# Patient Record
Sex: Male | Born: 1955 | Race: White | Hispanic: No | Marital: Married | Smoking: Never smoker
Health system: Southern US, Community
[De-identification: ages and names within clinical notes are randomized; demographics above are authoritative.]

## PROBLEM LIST (undated history)

## (undated) DIAGNOSIS — F419 Anxiety disorder, unspecified: Secondary | ICD-10-CM

## (undated) DIAGNOSIS — R200 Anesthesia of skin: Secondary | ICD-10-CM

## (undated) DIAGNOSIS — R351 Nocturia: Secondary | ICD-10-CM

## (undated) DIAGNOSIS — R202 Paresthesia of skin: Secondary | ICD-10-CM

## (undated) DIAGNOSIS — G4700 Insomnia, unspecified: Secondary | ICD-10-CM

## (undated) DIAGNOSIS — E039 Hypothyroidism, unspecified: Secondary | ICD-10-CM

## (undated) DIAGNOSIS — Z889 Allergy status to unspecified drugs, medicaments and biological substances status: Secondary | ICD-10-CM

## (undated) DIAGNOSIS — F410 Panic disorder [episodic paroxysmal anxiety] without agoraphobia: Secondary | ICD-10-CM

## (undated) DIAGNOSIS — M1990 Unspecified osteoarthritis, unspecified site: Secondary | ICD-10-CM

## (undated) DIAGNOSIS — Z9889 Other specified postprocedural states: Secondary | ICD-10-CM

## (undated) DIAGNOSIS — R112 Nausea with vomiting, unspecified: Secondary | ICD-10-CM

## (undated) HISTORY — PX: CARPAL TUNNEL RELEASE: SHX101

## (undated) HISTORY — PX: COLONOSCOPY: SHX174

## (undated) HISTORY — PX: FRACTURE SURGERY: SHX138

## (undated) HISTORY — PX: KNEE SURGERY: SHX244

## (undated) HISTORY — PX: BACK SURGERY: SHX140

## (undated) HISTORY — PX: MUSCLE REPAIR: SHX867

---

## 2013-06-02 ENCOUNTER — Other Ambulatory Visit: Payer: Self-pay | Admitting: Neurosurgery

## 2013-06-23 ENCOUNTER — Inpatient Hospital Stay (HOSPITAL_COMMUNITY): Admission: RE | Admit: 2013-06-23 | Payer: Self-pay | Source: Ambulatory Visit | Admitting: Neurosurgery

## 2013-06-23 ENCOUNTER — Encounter (HOSPITAL_COMMUNITY): Admission: RE | Payer: Self-pay | Source: Ambulatory Visit

## 2013-06-23 SURGERY — FOR MAXIMUM ACCESS (MAS) POSTERIOR LUMBAR INTERBODY FUSION (PLIF) 1 LEVEL
Anesthesia: General | Site: Back

## 2013-10-24 ENCOUNTER — Other Ambulatory Visit: Payer: Self-pay | Admitting: Neurosurgery

## 2013-11-17 NOTE — Pre-Procedure Instructions (Signed)
Nicholas Morse  11/17/2013   Your procedure is scheduled on: Tuesday, Sept. 15, 2015   Report to [facility] Admitting at 7:00 AM ( per MD)  Call this number if you have problems the morning of surgery: [phone]   Remember:   Do not eat food or drink liquids after midnight.   Take these medicines the morning of surgery with A SIP OF WATER: levothyroxine (SYNTHROID),  If needed: ALPRAZolam Prudy Feeler) for anxiety, HYDROcodone for pain,  albuterol (PROVENTIL0 inhaler for wheezing or shortness of breath 9 bring inhaler in with you on day of procedure) .  Stop taking Aspirin, vitamins, and herbal medications. Do not take any NSAIDs ie: Ibuprofen, Advil, Naproxen or any medication containing Aspirin.   Do not wear jewelry, make-up or nail polish.  Do not wear lotions, powders, or perfumes. You may not wear deodorant.  Do not shave 48 hours prior to surgery. Men may shave face and neck.  Do not bring valuables to the hospital.  [facility] is not responsible for any belongings or valuables.               Contacts, dentures or bridgework may not be worn into surgery.  Leave suitcase in the car. After surgery it may be brought to your room.  For patients admitted to the hospital, discharge time is determined by your treatment team.               Patients discharged the day of surgery will not be allowed to drive home.  Name and phone number of your driver:   Special Instructions:  Special Instructions:Special Instructions: [facility] - Preparing for Surgery  Before surgery, you can play an important role.  Because skin is not sterile, your skin needs to be as free of germs as possible.  You can reduce the number of germs on you skin by washing with CHG (chlorahexidine gluconate) soap before surgery.  CHG is an antiseptic cleaner which kills germs and bonds with the skin to continue killing germs even after washing.  Please DO NOT use if you have an allergy to CHG or antibacterial soaps.   If your skin becomes reddened/irritated stop using the CHG and inform your nurse when you arrive at Short Stay.  Do not shave (including legs and underarms) for at least 48 hours prior to the first CHG shower.  You may shave your face.  Please follow these instructions carefully:   1.  Shower with CHG Soap the night before surgery and the morning of Surgery.  2.  If you choose to wash your hair, wash your hair first as usual with your normal shampoo.  3.  After you shampoo, rinse your hair and body thoroughly to remove the Shampoo.  4.  Use CHG as you would any other liquid soap.  You can apply chg directly  to the skin and wash gently with scrungie or a clean washcloth.  5.  Apply the CHG Soap to your body ONLY FROM THE NECK DOWN.  Do not use on open wounds or open sores.  Avoid contact with your eyes, ears, mouth and genitals (private parts).  Wash genitals (private parts) with your normal soap.  6.  Wash thoroughly, paying special attention to the area where your surgery will be performed.  7.  Thoroughly rinse your body with warm water from the neck down.  8.  DO NOT shower/wash with your normal soap after using and rinsing off the CHG Soap.  9.  Pat yourself dry with a clean towel.            10.  Wear clean pajamas.            11.  Place clean sheets on your bed the night of your first shower and do not sleep with pets.  Day of Surgery  Do not apply any lotions/deodorants the morning of surgery.  Please wear clean clothes to the hospital/surgery center.   Please read over the following fact sheets that you were given: Pain Booklet, Coughing and Deep Breathing, Blood Transfusion Information, MRSA Information and Surgical Site Infection Prevention

## 2013-11-20 ENCOUNTER — Encounter (HOSPITAL_COMMUNITY)
Admission: RE | Admit: 2013-11-20 | Discharge: 2013-11-20 | Disposition: A | Discharge status: Home / Self Care | Payer: BC Managed Care – PPO | Source: Ambulatory Visit | Attending: Neurosurgery | Admitting: Neurosurgery

## 2013-11-20 ENCOUNTER — Encounter (HOSPITAL_COMMUNITY): Payer: Self-pay

## 2013-11-20 HISTORY — DX: Insomnia, unspecified: G47.00

## 2013-11-20 HISTORY — DX: Nocturia: R35.1

## 2013-11-20 HISTORY — DX: Hypothyroidism, unspecified: E03.9

## 2013-11-20 HISTORY — DX: Anesthesia of skin: R20.2

## 2013-11-20 HISTORY — DX: Panic disorder (episodic paroxysmal anxiety): F41.0

## 2013-11-20 HISTORY — DX: Allergy status to unspecified drugs, medicaments and biological substances: Z88.9

## 2013-11-20 HISTORY — DX: Anxiety disorder, unspecified: F41.9

## 2013-11-20 HISTORY — DX: Nausea with vomiting, unspecified: R11.2

## 2013-11-20 HISTORY — DX: Other specified postprocedural states: Z98.890

## 2013-11-20 HISTORY — DX: Paresthesia of skin: R20.0

## 2013-11-20 HISTORY — DX: Unspecified osteoarthritis, unspecified site: M19.90

## 2013-11-20 LAB — BASIC METABOLIC PANEL
ANION GAP: 11 (ref 5–15)
BUN: 16 mg/dL (ref 6–23)
CO2: 27 meq/L (ref 19–32)
Calcium: 9.8 mg/dL (ref 8.4–10.5)
Chloride: 98 mEq/L (ref 96–112)
Creatinine, Ser: 0.98 mg/dL (ref 0.50–1.35)
GFR calc Af Amer: >90 mL/min (ref >90)
GFR, EST NON AFRICAN AMERICAN: 88 mL/min — AB (ref >90)
GLUCOSE: 134 mg/dL — AB (ref 70–99)
POTASSIUM: 4.6 meq/L (ref 3.7–5.3)
SODIUM: 136 meq/L — AB (ref 137–147)

## 2013-11-20 LAB — CBC
HCT: 48.5 % (ref 39.0–52.0)
Hemoglobin: 17.4 g/dL — ABNORMAL HIGH (ref 13.0–17.0)
MCH: 32.7 pg (ref 26.0–34.0)
MCHC: 35.9 g/dL (ref 30.0–36.0)
MCV: 91.2 fL (ref 78.0–100.0)
Platelets: 207 10*3/uL (ref 150–400)
RBC: 5.32 MIL/uL (ref 4.22–5.81)
RDW: 13.7 % (ref 11.5–15.5)
WBC: 7.4 10*3/uL (ref 4.0–10.5)

## 2013-11-20 LAB — SURGICAL PCR SCREEN
MRSA, PCR: NEGATIVE
STAPHYLOCOCCUS AUREUS: POSITIVE — AB

## 2013-11-20 LAB — ABO/RH: ABO/RH(D): A POS

## 2013-11-20 LAB — TYPE AND SCREEN
ABO/RH(D): A POS
Antibody Screen: NEGATIVE

## 2013-11-20 MED ORDER — CEFAZOLIN SODIUM-DEXTROSE 2-3 GM-% IV SOLR
2.0000 g | INTRAVENOUS | Status: AC
  Administered 2013-11-21 (×2): 2 g via INTRAVENOUS
  Filled 2013-11-20: qty 50

## 2013-11-20 NOTE — Progress Notes (Signed)
11/20/13 0828  OBSTRUCTIVE SLEEP APNEA  Have you ever been diagnosed with sleep apnea through a sleep study? No  Do you snore loudly (loud enough to be heard through closed doors)?  0  Do you often feel tired, fatigued, or sleepy during the daytime? 1  Has anyone observed you stop breathing during your sleep? 0  Do you have, or are you being treated for high blood pressure? 0  BMI more than 35 kg/m2? 0  Age over 60 years old? 1  Neck circumference greater than 40 cm/16 inches? 1  Gender: 1  Obstructive Sleep Apnea Score 4

## 2013-11-21 ENCOUNTER — Inpatient Hospital Stay (HOSPITAL_COMMUNITY): Payer: BC Managed Care – PPO | Admitting: Anesthesiology

## 2013-11-21 ENCOUNTER — Encounter (HOSPITAL_COMMUNITY): Payer: Self-pay | Admitting: *Deleted

## 2013-11-21 ENCOUNTER — Inpatient Hospital Stay (HOSPITAL_COMMUNITY): Payer: BC Managed Care – PPO

## 2013-11-21 ENCOUNTER — Encounter (HOSPITAL_COMMUNITY)
Admission: RE | Disposition: A | Discharge status: Home / Self Care | Payer: BC Managed Care – PPO | Source: Ambulatory Visit | Attending: Neurosurgery

## 2013-11-21 ENCOUNTER — Encounter (HOSPITAL_COMMUNITY): Payer: BC Managed Care – PPO | Admitting: Anesthesiology

## 2013-11-21 ENCOUNTER — Inpatient Hospital Stay (HOSPITAL_COMMUNITY)
Admission: RE | Admit: 2013-11-21 | Discharge: 2013-11-22 | DRG: 460 | Disposition: A | Discharge status: Home / Self Care | Payer: BC Managed Care – PPO | Source: Ambulatory Visit | Attending: Neurosurgery | Admitting: Neurosurgery

## 2013-11-21 DIAGNOSIS — Z79899 Other long term (current) drug therapy: Secondary | ICD-10-CM | POA: Diagnosis not present

## 2013-11-21 DIAGNOSIS — M5126 Other intervertebral disc displacement, lumbar region: Principal | ICD-10-CM | POA: Diagnosis present

## 2013-11-21 DIAGNOSIS — M545 Low back pain, unspecified: Secondary | ICD-10-CM | POA: Diagnosis present

## 2013-11-21 DIAGNOSIS — M4310 Spondylolisthesis, site unspecified: Secondary | ICD-10-CM | POA: Diagnosis present

## 2013-11-21 DIAGNOSIS — M4316 Spondylolisthesis, lumbar region: Secondary | ICD-10-CM | POA: Diagnosis present

## 2013-11-21 DIAGNOSIS — R030 Elevated blood-pressure reading, without diagnosis of hypertension: Secondary | ICD-10-CM | POA: Diagnosis present

## 2013-11-21 HISTORY — PX: MAXIMUM ACCESS (MAS)POSTERIOR LUMBAR INTERBODY FUSION (PLIF) 2 LEVEL: SHX6369

## 2013-11-21 SURGERY — FOR MAXIMUM ACCESS (MAS) POSTERIOR LUMBAR INTERBODY FUSION (PLIF) 2 LEVEL
Anesthesia: General | Site: Back

## 2013-11-21 MED ORDER — PROPOFOL 10 MG/ML IV BOLUS
INTRAVENOUS | Status: AC
  Filled 2013-11-21: qty 20

## 2013-11-21 MED ORDER — PROPOFOL 10 MG/ML IV BOLUS
INTRAVENOUS | Status: AC
Start: 2013-11-21 — End: 2013-11-21
  Filled 2013-11-21: qty 20

## 2013-11-21 MED ORDER — PHENYLEPHRINE HCL 10 MG/ML IJ SOLN
INTRAMUSCULAR | Status: AC
  Filled 2013-11-21: qty 1

## 2013-11-21 MED ORDER — SCOPOLAMINE 1 MG/3DAYS TD PT72
MEDICATED_PATCH | TRANSDERMAL | Status: DC | PRN
  Administered 2013-11-21: 1 via TRANSDERMAL

## 2013-11-21 MED ORDER — SCOPOLAMINE 1 MG/3DAYS TD PT72
MEDICATED_PATCH | TRANSDERMAL | Status: AC
  Filled 2013-11-21: qty 1

## 2013-11-21 MED ORDER — LIDOCAINE-EPINEPHRINE 1 %-1:100000 IJ SOLN
INTRAMUSCULAR | Status: DC | PRN
  Administered 2013-11-21: 10 mL

## 2013-11-21 MED ORDER — FENTANYL CITRATE 0.05 MG/ML IJ SOLN
INTRAMUSCULAR | Status: AC
  Filled 2013-11-21: qty 5

## 2013-11-21 MED ORDER — HYDROMORPHONE HCL PF 1 MG/ML IJ SOLN
INTRAMUSCULAR | Status: AC
  Filled 2013-11-21: qty 1

## 2013-11-21 MED ORDER — SUCCINYLCHOLINE CHLORIDE 20 MG/ML IJ SOLN
INTRAMUSCULAR | Status: DC | PRN
  Administered 2013-11-21: 100 mg via INTRAVENOUS

## 2013-11-21 MED ORDER — AMPHETAMINE-DEXTROAMPHETAMINE 10 MG PO TABS: 20.0000 mg | ORAL_TABLET | Freq: Two times a day (BID) | ORAL | Status: DC

## 2013-11-21 MED ORDER — BUPIVACAINE LIPOSOME 1.3 % IJ SUSP
INTRAMUSCULAR | Status: DC | PRN
  Administered 2013-11-21: 20 mL

## 2013-11-21 MED ORDER — MUPIROCIN 2 % EX OINT
TOPICAL_OINTMENT | Freq: Two times a day (BID) | CUTANEOUS | Status: DC
  Administered 2013-11-22: 09:00:00 via NASAL

## 2013-11-21 MED ORDER — POLYETHYLENE GLYCOL 3350 17 G PO PACK
17.0000 g | PACK | Freq: Every day | ORAL | Status: DC | PRN
  Filled 2013-11-21: qty 1

## 2013-11-21 MED ORDER — SODIUM CHLORIDE 0.9 % IJ SOLN: 3.0000 mL | INTRAMUSCULAR | Status: DC | PRN

## 2013-11-21 MED ORDER — ACETAMINOPHEN 325 MG PO TABS: 650.0000 mg | ORAL_TABLET | ORAL | Status: DC | PRN

## 2013-11-21 MED ORDER — ALBUTEROL SULFATE HFA 108 (90 BASE) MCG/ACT IN AERS: 1.0000 | INHALATION_SPRAY | Freq: Four times a day (QID) | RESPIRATORY_TRACT | Status: DC | PRN

## 2013-11-21 MED ORDER — LACTATED RINGERS IV SOLN
INTRAVENOUS | Status: DC | PRN
  Administered 2013-11-21 (×2): via INTRAVENOUS

## 2013-11-21 MED ORDER — SUCCINYLCHOLINE CHLORIDE 20 MG/ML IJ SOLN
INTRAMUSCULAR | Status: AC
  Filled 2013-11-21: qty 1

## 2013-11-21 MED ORDER — LEVOTHYROXINE SODIUM 150 MCG PO TABS
150.0000 ug | ORAL_TABLET | Freq: Every day | ORAL | Status: DC
  Administered 2013-11-22: 150 ug via ORAL
  Filled 2013-11-21 (×2): qty 1

## 2013-11-21 MED ORDER — 0.9 % SODIUM CHLORIDE (POUR BTL) OPTIME
TOPICAL | Status: DC | PRN
  Administered 2013-11-21: 1000 mL

## 2013-11-21 MED ORDER — LIDOCAINE HCL (CARDIAC) 20 MG/ML IV SOLN
INTRAVENOUS | Status: AC
  Filled 2013-11-21: qty 5

## 2013-11-21 MED ORDER — KCL IN DEXTROSE-NACL 20-5-0.45 MEQ/L-%-% IV SOLN
INTRAVENOUS | Status: DC
  Filled 2013-11-21 (×3): qty 1000

## 2013-11-21 MED ORDER — TESTOSTERONE 20.25 MG/ACT (1.62%) TD GEL: 10.0000 g | Freq: Every day | TRANSDERMAL | Status: DC

## 2013-11-21 MED ORDER — BISACODYL 10 MG RE SUPP: 10.0000 mg | Freq: Every day | RECTAL | Status: DC | PRN

## 2013-11-21 MED ORDER — PHENOL 1.4 % MT LIQD: 1.0000 | OROMUCOSAL | Status: DC | PRN

## 2013-11-21 MED ORDER — DOCUSATE SODIUM 100 MG PO CAPS
100.0000 mg | ORAL_CAPSULE | Freq: Two times a day (BID) | ORAL | Status: DC
  Administered 2013-11-21 – 2013-11-22 (×2): 100 mg via ORAL
  Filled 2013-11-21 (×3): qty 1

## 2013-11-21 MED ORDER — SENNA 8.6 MG PO TABS
1.0000 | ORAL_TABLET | Freq: Two times a day (BID) | ORAL | Status: DC
  Administered 2013-11-21 – 2013-11-22 (×2): 8.6 mg via ORAL
  Filled 2013-11-21 (×3): qty 1

## 2013-11-21 MED ORDER — OXYCODONE HCL 5 MG PO TABS: 5.0000 mg | ORAL_TABLET | Freq: Once | ORAL | Status: DC | PRN

## 2013-11-21 MED ORDER — HYDROCODONE-ACETAMINOPHEN 5-325 MG PO TABS: 1.0000 | ORAL_TABLET | Freq: Two times a day (BID) | ORAL | Status: DC | PRN

## 2013-11-21 MED ORDER — ARTIFICIAL TEARS OP OINT
TOPICAL_OINTMENT | OPHTHALMIC | Status: DC | PRN
  Administered 2013-11-21: 1 via OPHTHALMIC

## 2013-11-21 MED ORDER — BUPIVACAINE HCL (PF) 0.5 % IJ SOLN
INTRAMUSCULAR | Status: DC | PRN
  Administered 2013-11-21: 10 mL

## 2013-11-21 MED ORDER — ACETAMINOPHEN 650 MG RE SUPP: 650.0000 mg | RECTAL | Status: DC | PRN

## 2013-11-21 MED ORDER — LACTATED RINGERS IV SOLN
INTRAVENOUS | Status: DC | PRN
  Administered 2013-11-21: 11:00:00 via INTRAVENOUS

## 2013-11-21 MED ORDER — MORPHINE SULFATE 2 MG/ML IJ SOLN
1.0000 mg | INTRAMUSCULAR | Status: DC | PRN
  Administered 2013-11-21 – 2013-11-22 (×2): 4 mg via INTRAVENOUS
  Filled 2013-11-21 (×2): qty 2

## 2013-11-21 MED ORDER — ONDANSETRON HCL 4 MG/2ML IJ SOLN: 4.0000 mg | INTRAMUSCULAR | Status: DC | PRN

## 2013-11-21 MED ORDER — PROPOFOL 10 MG/ML IV BOLUS
INTRAVENOUS | Status: DC | PRN
  Administered 2013-11-21: 140 mg via INTRAVENOUS
  Administered 2013-11-21: 60 mg via INTRAVENOUS

## 2013-11-21 MED ORDER — CEFAZOLIN SODIUM-DEXTROSE 2-3 GM-% IV SOLR
INTRAVENOUS | Status: AC
  Filled 2013-11-21: qty 50

## 2013-11-21 MED ORDER — DEXAMETHASONE SODIUM PHOSPHATE 10 MG/ML IJ SOLN
INTRAMUSCULAR | Status: AC
Start: 2013-11-21 — End: 2013-11-21
  Filled 2013-11-21: qty 1

## 2013-11-21 MED ORDER — PROPOFOL INFUSION 10 MG/ML OPTIME
INTRAVENOUS | Status: DC | PRN
  Administered 2013-11-21: 11:00:00 via INTRAVENOUS
  Administered 2013-11-21: 50 ug/kg/min via INTRAVENOUS
  Administered 2013-11-21: 12:00:00 via INTRAVENOUS

## 2013-11-21 MED ORDER — METHOCARBAMOL 500 MG PO TABS
500.0000 mg | ORAL_TABLET | Freq: Four times a day (QID) | ORAL | Status: DC | PRN
  Administered 2013-11-21 – 2013-11-22 (×3): 500 mg via ORAL
  Filled 2013-11-21 (×3): qty 1

## 2013-11-21 MED ORDER — BUPIVACAINE LIPOSOME 1.3 % IJ SUSP
20.0000 mL | Freq: Once | INTRAMUSCULAR | Status: DC
  Filled 2013-11-21: qty 20

## 2013-11-21 MED ORDER — ONDANSETRON HCL 4 MG/2ML IJ SOLN
INTRAMUSCULAR | Status: DC | PRN
  Administered 2013-11-21: 4 mg via INTRAVENOUS

## 2013-11-21 MED ORDER — LACTATED RINGERS IV SOLN
INTRAVENOUS | Status: DC
  Administered 2013-11-21: 08:00:00 via INTRAVENOUS

## 2013-11-21 MED ORDER — OXYCODONE-ACETAMINOPHEN 5-325 MG PO TABS: 1.0000 | ORAL_TABLET | ORAL | Status: DC | PRN

## 2013-11-21 MED ORDER — SODIUM CHLORIDE 0.9 % IJ SOLN
3.0000 mL | Freq: Two times a day (BID) | INTRAMUSCULAR | Status: DC
  Administered 2013-11-21: 3 mL via INTRAVENOUS

## 2013-11-21 MED ORDER — OXYCODONE HCL 5 MG/5ML PO SOLN: 5.0000 mg | Freq: Once | ORAL | Status: DC | PRN

## 2013-11-21 MED ORDER — HYDROCODONE-ACETAMINOPHEN 10-325 MG PO TABS
1.0000 | ORAL_TABLET | ORAL | Status: DC | PRN
  Administered 2013-11-21 – 2013-11-22 (×4): 2 via ORAL
  Filled 2013-11-21 (×4): qty 2

## 2013-11-21 MED ORDER — ALUM & MAG HYDROXIDE-SIMETH 200-200-20 MG/5ML PO SUSP: 30.0000 mL | Freq: Four times a day (QID) | ORAL | Status: DC | PRN

## 2013-11-21 MED ORDER — SODIUM CHLORIDE 0.9 % IV SOLN
10.0000 mg | INTRAVENOUS | Status: DC | PRN
  Administered 2013-11-21: 25 ug/min via INTRAVENOUS

## 2013-11-21 MED ORDER — HYDROCODONE-ACETAMINOPHEN 5-325 MG PO TABS
1.0000 | ORAL_TABLET | ORAL | Status: DC | PRN
  Administered 2013-11-21: 2 via ORAL
  Filled 2013-11-21: qty 2

## 2013-11-21 MED ORDER — PHENYLEPHRINE 40 MCG/ML (10ML) SYRINGE FOR IV PUSH (FOR BLOOD PRESSURE SUPPORT)
PREFILLED_SYRINGE | INTRAVENOUS | Status: AC
  Filled 2013-11-21: qty 10

## 2013-11-21 MED ORDER — CEFAZOLIN SODIUM 1-5 GM-% IV SOLN
1.0000 g | Freq: Three times a day (TID) | INTRAVENOUS | Status: AC
  Administered 2013-11-21 – 2013-11-22 (×2): 1 g via INTRAVENOUS
  Filled 2013-11-21 (×2): qty 50

## 2013-11-21 MED ORDER — TEMAZEPAM 15 MG PO CAPS
60.0000 mg | ORAL_CAPSULE | Freq: Every day | ORAL | Status: DC
  Administered 2013-11-21: 60 mg via ORAL
  Filled 2013-11-21: qty 4

## 2013-11-21 MED ORDER — MIDAZOLAM HCL 2 MG/2ML IJ SOLN
INTRAMUSCULAR | Status: AC
  Filled 2013-11-21: qty 2

## 2013-11-21 MED ORDER — ALPRAZOLAM 0.5 MG PO TABS: 1.0000 mg | ORAL_TABLET | Freq: Two times a day (BID) | ORAL | Status: DC | PRN

## 2013-11-21 MED ORDER — FLEET ENEMA 7-19 GM/118ML RE ENEM
1.0000 | ENEMA | Freq: Once | RECTAL | Status: AC | PRN
  Filled 2013-11-21: qty 1

## 2013-11-21 MED ORDER — ALBUTEROL SULFATE (2.5 MG/3ML) 0.083% IN NEBU: 2.5000 mg | INHALATION_SOLUTION | Freq: Four times a day (QID) | RESPIRATORY_TRACT | Status: DC | PRN

## 2013-11-21 MED ORDER — LIDOCAINE HCL (CARDIAC) 20 MG/ML IV SOLN
INTRAVENOUS | Status: DC | PRN
  Administered 2013-11-21: 30 mg via INTRAVENOUS
  Administered 2013-11-21 (×2): 50 mg via INTRAVENOUS

## 2013-11-21 MED ORDER — PHENYLEPHRINE HCL 10 MG/ML IJ SOLN
INTRAMUSCULAR | Status: DC | PRN
  Administered 2013-11-21: 40 ug via INTRAVENOUS
  Administered 2013-11-21: 80 ug via INTRAVENOUS
  Administered 2013-11-21: 40 ug via INTRAVENOUS
  Administered 2013-11-21 (×3): 80 ug via INTRAVENOUS

## 2013-11-21 MED ORDER — MIDAZOLAM HCL 5 MG/5ML IJ SOLN
INTRAMUSCULAR | Status: DC | PRN
  Administered 2013-11-21: 2 mg via INTRAVENOUS

## 2013-11-21 MED ORDER — FENTANYL CITRATE 0.05 MG/ML IJ SOLN
INTRAMUSCULAR | Status: DC | PRN
  Administered 2013-11-21 (×2): 50 ug via INTRAVENOUS
  Administered 2013-11-21: 200 ug via INTRAVENOUS
  Administered 2013-11-21 (×3): 50 ug via INTRAVENOUS
  Administered 2013-11-21: 100 ug via INTRAVENOUS
  Administered 2013-11-21 (×2): 50 ug via INTRAVENOUS

## 2013-11-21 MED ORDER — ONDANSETRON HCL 4 MG/2ML IJ SOLN
INTRAMUSCULAR | Status: AC
  Filled 2013-11-21: qty 2

## 2013-11-21 MED ORDER — DEXAMETHASONE SODIUM PHOSPHATE 10 MG/ML IJ SOLN
INTRAMUSCULAR | Status: DC | PRN
  Administered 2013-11-21: 10 mg via INTRAVENOUS

## 2013-11-21 MED ORDER — MENTHOL 3 MG MT LOZG: 1.0000 | LOZENGE | OROMUCOSAL | Status: DC | PRN

## 2013-11-21 MED ORDER — THROMBIN 20000 UNITS EX SOLR
CUTANEOUS | Status: DC | PRN
  Administered 2013-11-21: 10:00:00 via TOPICAL

## 2013-11-21 MED ORDER — HYDROMORPHONE HCL PF 1 MG/ML IJ SOLN
0.2500 mg | INTRAMUSCULAR | Status: DC | PRN
  Administered 2013-11-21 (×4): 0.5 mg via INTRAVENOUS

## 2013-11-21 SURGICAL SUPPLY — 84 items
BAG DECANTER FOR FLEXI CONT (MISCELLANEOUS) ×3 IMPLANT
BENZOIN TINCTURE PRP APPL 2/3 (GAUZE/BANDAGES/DRESSINGS) IMPLANT
BLADE SURG ROTATE 9660 (MISCELLANEOUS) IMPLANT
BONE MATRIX OSTEOCEL PRO MED (Bone Implant) ×6 IMPLANT
BUR MATCHSTICK NEURO 3.0 LAGG (BURR) ×3 IMPLANT
BUR PRECISION FLUTE 5.0 (BURR) ×6 IMPLANT
CAGE MAS PLIF 9X9X23-8 LUMBAR (Cage) ×12 IMPLANT
CANISTER SUCT 3000ML (MISCELLANEOUS) ×3 IMPLANT
CLOSURE WOUND 1/2 X4 (GAUZE/BANDAGES/DRESSINGS) ×1
CONT SPEC 4OZ CLIKSEAL STRL BL (MISCELLANEOUS) ×6 IMPLANT
COVER BACK TABLE 24X17X13 BIG (DRAPES) IMPLANT
COVER TABLE BACK 60X90 (DRAPES) ×3 IMPLANT
DERMABOND ADVANCED (GAUZE/BANDAGES/DRESSINGS) ×2
DERMABOND ADVANCED .7 DNX12 (GAUZE/BANDAGES/DRESSINGS) ×1 IMPLANT
DRAPE C-ARM 42X72 X-RAY (DRAPES) ×3 IMPLANT
DRAPE C-ARMOR (DRAPES) ×3 IMPLANT
DRAPE LAPAROTOMY 100X72X124 (DRAPES) ×3 IMPLANT
DRAPE POUCH INSTRU U-SHP 10X18 (DRAPES) ×3 IMPLANT
DRAPE SURG 17X23 STRL (DRAPES) ×3 IMPLANT
DRSG OPSITE POSTOP 4X6 (GAUZE/BANDAGES/DRESSINGS) ×3 IMPLANT
DRSG TELFA 3X8 NADH (GAUZE/BANDAGES/DRESSINGS) IMPLANT
DURAPREP 26ML APPLICATOR (WOUND CARE) ×3 IMPLANT
ELECT BLADE 4.0 EZ CLEAN MEGAD (MISCELLANEOUS) ×3
ELECT REM PT RETURN 9FT ADLT (ELECTROSURGICAL) ×3
ELECTRODE BLDE 4.0 EZ CLN MEGD (MISCELLANEOUS) ×1 IMPLANT
ELECTRODE REM PT RTRN 9FT ADLT (ELECTROSURGICAL) ×1 IMPLANT
EVACUATOR 1/8 PVC DRAIN (DRAIN) ×3 IMPLANT
GAUZE SPONGE 4X4 12PLY STRL (GAUZE/BANDAGES/DRESSINGS) ×3 IMPLANT
GAUZE SPONGE 4X4 16PLY XRAY LF (GAUZE/BANDAGES/DRESSINGS) IMPLANT
GLOVE BIO SURGEON STRL SZ8 (GLOVE) ×6 IMPLANT
GLOVE BIOGEL PI IND STRL 8 (GLOVE) ×2 IMPLANT
GLOVE BIOGEL PI IND STRL 8.5 (GLOVE) ×3 IMPLANT
GLOVE BIOGEL PI INDICATOR 8 (GLOVE) ×4
GLOVE BIOGEL PI INDICATOR 8.5 (GLOVE) ×6
GLOVE ECLIPSE 8.0 STRL XLNG CF (GLOVE) ×6 IMPLANT
GLOVE ECLIPSE 8.5 STRL (GLOVE) ×3 IMPLANT
GLOVE EXAM NITRILE LRG STRL (GLOVE) ×6 IMPLANT
GLOVE EXAM NITRILE MD LF STRL (GLOVE) IMPLANT
GLOVE EXAM NITRILE XL STR (GLOVE) IMPLANT
GLOVE EXAM NITRILE XS STR PU (GLOVE) IMPLANT
GOWN STRL REUS W/ TWL LRG LVL3 (GOWN DISPOSABLE) IMPLANT
GOWN STRL REUS W/ TWL XL LVL3 (GOWN DISPOSABLE) ×3 IMPLANT
GOWN STRL REUS W/TWL 2XL LVL3 (GOWN DISPOSABLE) ×6 IMPLANT
GOWN STRL REUS W/TWL LRG LVL3 (GOWN DISPOSABLE)
GOWN STRL REUS W/TWL XL LVL3 (GOWN DISPOSABLE) ×6
KIT BASIN OR (CUSTOM PROCEDURE TRAY) ×3 IMPLANT
KIT NEEDLE NVM5 EMG ELECT (KITS) ×1 IMPLANT
KIT NEEDLE NVM5 EMG ELECTRODE (KITS) ×2
KIT POSITION SURG JACKSON T1 (MISCELLANEOUS) ×3 IMPLANT
KIT ROOM TURNOVER OR (KITS) ×3 IMPLANT
MILL MEDIUM DISP (BLADE) ×3 IMPLANT
NEEDLE HYPO 21X1.5 SAFETY (NEEDLE) ×3 IMPLANT
NEEDLE HYPO 25X1 1.5 SAFETY (NEEDLE) ×3 IMPLANT
NEEDLE SPNL 18GX3.5 QUINCKE PK (NEEDLE) IMPLANT
NS IRRIG 1000ML POUR BTL (IV SOLUTION) ×3 IMPLANT
PACK LAMINECTOMY NEURO (CUSTOM PROCEDURE TRAY) ×3 IMPLANT
PAD ARMBOARD 7.5X6 YLW CONV (MISCELLANEOUS) ×9 IMPLANT
PATTIES SURGICAL .5 X.5 (GAUZE/BANDAGES/DRESSINGS) IMPLANT
PATTIES SURGICAL .5 X1 (DISPOSABLE) IMPLANT
PATTIES SURGICAL 1X1 (DISPOSABLE) IMPLANT
ROD 50MM LUMBAR (Rod) ×6 IMPLANT
SCREW LOCK (Screw) ×12 IMPLANT
SCREW LOCK FXNS SPNE MAS PL (Screw) ×6 IMPLANT
SCREW PLIF MAS 5.5X35 LUMBAR (Screw) ×6 IMPLANT
SCREW SHANK 6.5X65 (Screw) ×6 IMPLANT
SCREW SHANKS 5.5X35 (Screw) ×6 IMPLANT
SCREW TULIP 5.5 (Screw) ×12 IMPLANT
SPONGE LAP 4X18 X RAY DECT (DISPOSABLE) IMPLANT
SPONGE SURGIFOAM ABS GEL 100 (HEMOSTASIS) ×3 IMPLANT
STAPLER SKIN PROX WIDE 3.9 (STAPLE) ×3 IMPLANT
STRIP CLOSURE SKIN 1/2X4 (GAUZE/BANDAGES/DRESSINGS) ×2 IMPLANT
SUT VIC AB 1 CT1 18XBRD ANBCTR (SUTURE) ×2 IMPLANT
SUT VIC AB 1 CT1 8-18 (SUTURE) ×4
SUT VIC AB 2-0 CT1 18 (SUTURE) ×6 IMPLANT
SUT VIC AB 3-0 SH 8-18 (SUTURE) ×6 IMPLANT
SYR 20CC LL (SYRINGE) ×3 IMPLANT
SYR 20ML ECCENTRIC (SYRINGE) ×3 IMPLANT
SYR 3ML LL SCALE MARK (SYRINGE) IMPLANT
SYR 5ML LL (SYRINGE) IMPLANT
TOWEL OR 17X24 6PK STRL BLUE (TOWEL DISPOSABLE) ×3 IMPLANT
TOWEL OR 17X26 10 PK STRL BLUE (TOWEL DISPOSABLE) ×3 IMPLANT
TRAP SPECIMEN MUCOUS 40CC (MISCELLANEOUS) ×3 IMPLANT
TRAY FOLEY CATH 14FRSI W/METER (CATHETERS) IMPLANT
WATER STERILE IRR 1000ML POUR (IV SOLUTION) ×3 IMPLANT

## 2013-11-21 NOTE — Transfer of Care (Signed)
Immediate Anesthesia Transfer of Care Note  Patient: Nicholas Morse  Procedure(s) Performed: Procedure(s): FOR MAXIMUM ACCESS (MAS) POSTERIOR LUMBAR INTERBODY FUSION LUMBAR FOUR-FIVE, LUMBAR FIVE-SACRAL ONE WITH REDO DECOMPRESSION (N/A)  Patient Location: PACU  Anesthesia Type:General  Level of Consciousness: patient cooperative and responds to stimulation  Airway & Oxygen Therapy: Patient Spontanous Breathing and Patient connected to face mask oxygen  Post-op Assessment: Report given to PACU RN, Post -op Vital signs reviewed and stable and Patient moving all extremities X 4  Post vital signs: Reviewed and stable  Complications: No apparent anesthesia complications

## 2013-11-21 NOTE — Anesthesia Postprocedure Evaluation (Signed)
  Anesthesia Post-op Note  Patient: Nicholas Morse  Procedure(s) Performed: Procedure(s): FOR MAXIMUM ACCESS (MAS) POSTERIOR LUMBAR INTERBODY FUSION LUMBAR FOUR-FIVE, LUMBAR FIVE-SACRAL ONE WITH REDO DECOMPRESSION (N/A)  Patient Location: PACU  Anesthesia Type:General  Level of Consciousness: awake and alert   Airway and Oxygen Therapy: Patient Spontanous Breathing  Post-op Pain: moderate  Post-op Assessment: Post-op Vital signs reviewed, Patient's Cardiovascular Status Stable and Respiratory Function Stable  Post-op Vital Signs: Reviewed  Filed Vitals:   11/21/13 1541  BP: 116/80  Pulse: 87  Temp:   Resp: 17    Complications: No apparent anesthesia complications

## 2013-11-21 NOTE — Plan of Care (Signed)
Problem: Consults Goal: Diagnosis - Spinal Surgery Outcome: Completed/Met Date Met:  11/21/13 Thoraco/Lumbar Spine Fusion     

## 2013-11-21 NOTE — Evaluation (Addendum)
Occupational Therapy Evaluation Patient Details Name: Nicholas Morse MRN: [id number] DOB: [birth year] Today's Date: 11/21/2013    History of Present Illness 58 y.o. s/p MAXIMUM ACCESS (MAS) POSTERIOR LUMBAR INTERBODY FUSION LUMBAR FOUR-FIVE, LUMBAR FIVE-SACRAL ONE WITH REDO DECOMPRESSION   Clinical Impression   Pt s/p above. Pt independent with ADLs, PTA. Feel pt will benefit from acute OT to increase independence prior to d/c.     Follow Up Recommendations  No OT follow up;Supervision - Intermittent    Equipment Recommendations  3 in 1 bedside comode    Recommendations for Other Services       Precautions / Restrictions Precautions Precautions: Back Precaution Booklet Issued: Yes (comment) Precaution Comments: Educated on back precautions Required Braces or Orthoses: Spinal Brace Spinal Brace: Lumbar corset;Applied in sitting position;Applied in standing position Restrictions Weight Bearing Restrictions: No      Mobility Bed Mobility Overal bed mobility: Needs Assistance Bed Mobility: Rolling;Sidelying to Sit;Sit to Sidelying Rolling: Supervision Sidelying to sit: Supervision     Sit to sidelying: Modified independent (Device/Increase time) General bed mobility comments: cues for technique.  Transfers Overall transfer level: Needs assistance Equipment used: None Transfers: Sit to/from Stand Sit to Stand: Supervision         General transfer comment: cues for technique    Balance                                            ADL Overall ADL's : Needs assistance/impaired                 Upper Body Dressing : Minimal assistance;Sitting;Standing   Lower Body Dressing: Minimal assistance;Sit to/from stand;With adaptive equipment   Toilet Transfer: Min guard;Ambulation (bed)   Toileting- Clothing Manipulation and Hygiene: Supervision/safety;Sit to/from stand   Tub/ Shower Transfer: Min guard;Ambulation   Functional mobility during  ADLs: Min guard General ADL Comments: Educated on use of cup for teeth care and placement of grooming items to avoid breaking precautions. Educated on AE for LB ADLs as pt unable to cross legs over knees. Educated on back brace. Educated on Psychologist, prison and probation services. Discussed positioning of pillows.      Vision                     Perception     Praxis      Pertinent Vitals/Pain Pain Assessment: 0-10 Pain Score: 6  Pain Location: back Pain Descriptors / Indicators: Cramping     Hand Dominance Right   Extremity/Trunk Assessment Upper Extremity Assessment Upper Extremity Assessment: Overall WFL for tasks assessed   Lower Extremity Assessment Lower Extremity Assessment: Defer to PT evaluation       Communication Communication Communication: No difficulties   Cognition Arousal/Alertness: Awake/alert Behavior During Therapy: WFL for tasks assessed/performed Overall Cognitive Status: Within Functional Limits for tasks assessed                     General Comments       Exercises       Shoulder Instructions      Home Living Family/patient expects to be discharged to:: Private residence Living Arrangements: Spouse/significant other Available Help at Discharge: Family;Available 24 hours/day Type of Home: House Home Access: Stairs to enter [employer] of Steps: 2 Entrance Stairs-Rails: None Home Layout: Two level Alternate Level Stairs-Number of Steps: 16 Alternate Level Stairs-Rails: Left Bathroom Shower/Tub:  Walk-in shower;Tub/shower unit   [employer]: Standard     Home Equipment: Shower seat - built in;Other (comment) (stool that can be used for shower)          Prior Functioning/Environment Level of Independence: Independent             OT Diagnosis: Acute pain   OT Problem List: Impaired balance (sitting and/or standing);Decreased range of motion;Decreased knowledge of use of DME or AE;Decreased knowledge of precautions;Pain    OT Treatment/Interventions: Self-care/ADL training;DME and/or AE instruction;Therapeutic activities;Balance training;Patient/family education    OT Goals(Current goals can be found in the care plan section) Acute Rehab OT Goals Patient Stated Goal: not stated OT Goal Formulation: With patient Time For Goal Achievement: 11/28/13 Potential to Achieve Goals: Good ADL Goals Pt Will Perform Lower Body Dressing: with modified independence;with adaptive equipment;sit to/from stand Pt Will Transfer to Toilet: with modified independence;ambulating Additional ADL Goal #1: Pt will independently don/doff back brace. Additional ADL Goal #2: Pt will independently verbalize and demonstrate 3/3 back precautions.  OT Frequency: Min 2X/week   Barriers to D/C:            Co-evaluation              End of Session Equipment Utilized During Treatment: Back brace Nurse Communication: Other (comment) (3 in 1)  Activity Tolerance: Patient tolerated treatment well Patient left: in chair;with family/visitor present   Time: 5009-3818 OT Time Calculation (min): 21 min Charges:  OT General Charges $OT Visit: 1 Procedure OT Evaluation $Initial OT Evaluation Tier I: 1 Procedure OT Treatments $Self Care/Home Management : 8-22 mins G-CodesEarlie Raveling OTR/L [phone] 11/21/2013, 6:42 PM

## 2013-11-21 NOTE — Progress Notes (Signed)
Awake, alert, conversant.  Full strength both legs.  Doing well.  

## 2013-11-21 NOTE — Op Note (Signed)
11/21/2013  3:10 PM  PATIENT:  Nicholas Morse  58 y.o. male  PRE-OPERATIVE DIAGNOSIS:  Spondylolisthesis, recurrent lumbarStenosis, herniated lumbar disc,Radiculopathy, Lumbago L 45 and L 5 S 1 levels  POST-OPERATIVE DIAGNOSIS:  Spondylolisthesis, recurrent lumbarStenosis, herniated lumbar disc,Radiculopathy, Lumbago L 45 and L 5 S 1 levels   PROCEDURE:  Procedure(s): FOR MAXIMUM ACCESS (MAS) POSTERIOR LUMBAR INTERBODY FUSION LUMBAR FOUR-FIVE, LUMBAR FIVE-SACRAL ONE WITH REDO DECOMPRESSION (N/A) with PEEK interbody fusion, pedicle screw fixation and posterolateral arthrodesis  Decompression in excess of standard PLIF procedure with painstaking dissection through scar of previously decompressed neural elements (bilateral L 4, L 5, S 1)  SURGEON:  Surgeon(s) and Role:    * Maeola Harman, MD - Primary    * Barnett Abu, MD - Assisting  PHYSICIAN ASSISTANT:   ASSISTANTS: Poteat, RN   ANESTHESIA:   general  EBL:  Total I/O In: 2300 [I.V.:2300] Out: 1215 [Urine:715; Blood:500]  BLOOD ADMINISTERED:none  DRAINS: none   LOCAL MEDICATIONS USED:  MARCAINE     SPECIMEN:  No Specimen  DISPOSITION OF SPECIMEN:  N/A  COUNTS:  YES  TOURNIQUET:  * No tourniquets in log *  DICTATION: Patient is a 58 year old with spondylosis , recurrent stenosis, spondylolisthesis, disc herniation and severe back and right lower extremity pain at L4/5 and L 5 S 1 levels of the lumbar spine. It was elected to take him to surgery for redo decompression, MASPLIF L 45 and L 5 S 1 levels with posterolateral arthrodesis.  Procedure:   Following uncomplicated induction of GETA, and placement of electrodes for neural monitoring, patient was turned into a prone position on the [place] tableand using AP  fluoroscopy the area of planned incision was marked, prepped with betadine scrub and Duraprep, then draped. Exposure was performed of facet joint complex at L 45 and L 5 S 1 level and the MAS retractor was placed.5.5 x  35 mm cortical Nuvasive screws were placed at L 4 bilaterally according to standard landmarks using neural monitoring.  A total laminectomy of L 4 and L 5 was then performed with disarticulation of facets.  This was previously operated with abundant scar tissue and all neural elements were carefully dissected free from investing scar tissue.  Decompression waas far more extensive than that typically performed with standard PLIF procedure with dissection of all scarred in neural elements (bilateral L 4, L 5, S 1 nerve roots).This bone was saved for grafting, combined with Osteocel after being run through bone mill and was placed in bone packing device.  Thorough discectomy was performed bilaterally at L 45 and L 5 S 1 and the endplates were prepared for grafting.  23 x 9 x 8 degree cages were placed in the interspace and positioning was confirmed with AP and lateral fluoroscopy.  10 cc of autograft/Osteocel was packed in the interspace medial to the second cage at each level.   Remaining screws were placed at L 5 and S 1  and 50 mm rods were placed.   And the screws were locked and torqued. Final Xrays showed well positioned implants and screw fixation. The posterolateral region was packed with remaining 30 cc of autograft on the right of midline. The wounds were irrigated and then closed with 1, 2-0 and 3-0 Vicryl stitches. Sterile occlusive dressing was placed with Dermabond and an occlusive dressing. The patient was then extubated in the operating room and taken to recovery in stable and satisfactory condition having tolerated her operation well. Counts were correct at the  end of the case.  PLAN OF CARE: Admit to inpatient   PATIENT DISPOSITION:  PACU - hemodynamically stable.   Delay start of Pharmacological VTE agent (>24hrs) due to surgical blood loss or risk of bleeding: yes

## 2013-11-21 NOTE — H&P (Signed)
> [address] [place], [place] [address] Phone: [phone]   Patient ID:   [id number] Patient: Nicholas Morse  Date of Birth: [birth year] Visit Type: Patient Communication   Date: 11/08/2013 10:59 AM Provider: Danae Orleans. Venetia Maxon MD   This 58 year old male presents for Low back pain.  History of Present Illness: 1.  Low back pain  I spoke with the patient on the phone and he expressed concern about his right leg symptoms in particular and that there appeared to be nerve root compression at the L5-S1 level as well as the L4 L5 level.  I reviewed his MRI and radiographs in detail and do agree that this is concerning.  He has had prior laminectomy at L5-S1 on the right but has a significant degree of spondylosis and foraminal stenosis which appears to be affecting the right L5 nerve root in the foramen and extraforaminally.  Clearly his biggest problem relates to the L4 L5 level where he has severe spondylosis and stenosis and spondylolisthesis marked canal stenosis, I am also concerned about the L5 nerve root compression due to significant spondylosis at the L5-S1 level.  Because it is not possible to differentiate central stenosis as a cause of an L5 radiculopathy at the L4 L5 level from foraminal and extraforaminal stenosis at the L5-S1 level, I think it makes sense to proceed with decompression and stabilization at the L5-S1 level as well.  In doing this and remove his facet joint and totally decompress his L5 nerve root but this would render him unstable if not included in the fusion.  My concern would be that if we only address the L4-L5 pathology, there is a significant possibility that we would need to go back to surgery and extend fusion to the L5-S1 level if he were still symptomatic.  For these reasons I think it most appropriate to proceed with decompression and fusion at the L4 L5 and L5-S1 levels.  This is also in keeping with the patient's's and would not significantly  increased the extend of surgery compared to having to do 2 different surgeries.         MEDICATIONS(added, continued or stopped this visit):   Started Medication Directions Instruction Stopped   alprazolam 1 mg tablet take 1 tablet by oral route as needed     Amphetamine Salt Combo 20 mg tablet 2.5 tablets daily     levothyroxine 150 mcg tablet take 1 tablet by oral route  every day     meloxicam 15 mg tablet take 1 tablet by oral route  every day    11/08/2013 Norco 5 mg-325 mg tablet take 1 tablet by oral route  every 6 hours as needed for pain    10/16/2013 Percocet 10 mg-325 mg tablet take 1 tablet by oral route  every 6 hours as needed     temazepam 30 mg capsule take 1 capsule by oral route 2 times every day at bedtime as needed    10/16/2013 tizanidine 2 mg tablet take 1-2 tabs TID prn spasm    10/16/2013 tramadol 50 mg tablet take 1 tablet by oral route  every 6 hours as needed      ALLERGIES:       IMPRESSION Two-level disease both at L4 L5 and L5-S1  Assessment/Plan # Detail Type Description   1. Assessment Acquired spondylolisthesis (738.4).       2. 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       3.  Assessment Lumbar radiculopathy (724.4).         I have recommended decompression and fusion at both the L4-L5 and L5-S1 levels.    MEDICATIONS PRESCRIBED TODAY    Rx Quantity Refills  NORCO 5 mg-325 mg  60 0            Provider:  Danae Orleans. Venetia Maxon MD  11/13/2013 02:24 PM Dictation edited by: Danae Orleans. Venetia Maxon    CC Providers: Maeola Harman MD [address] [place], [place] [address] ----------------------------------------------------------------------------------------------------------------------------------------------------------------------         Electronically signed by Danae Orleans. Venetia Maxon MD on 11/13/2013 02:24 PM   [address] [place], [place] [address] Phone:  [phone]   Patient ID:   [id number] Patient: Nicholas Morse  Date of Birth: 03-08-1956 Visit Type: Office Visit   Date: 10/16/2013 11:00 AM Provider: Danae Orleans. Venetia Maxon MD   This 58 year old male presents for Follow Up of back pain and Follow Up of neck pain.  History of Present Illness: 1.  Follow Up of back pain  2.  Follow Up of neck pain  Tighe Gitto returns to discuss surgical options. Surgery had been scheduled earlier, but cancelled.  Lumbar & bilat foot pain has increased, not responsive to other medication/activity changes attempted since spring.  New trapezius pain x 3 weeks. Dr. Yetta Barre ordered cervical MRI to evaluate in DrStern's absence.  Pt reports concern for multiple meds, so he stopped Adderall ~2wks ago without notifying his MD. He does notice increasing difficulty focusing.  He will call his psychiatrist back to discuss.  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  Percocet 10/325 4/day  Cervical MRI on [facility].  I reviewed a cervical MRI which shows degenerative changes and facet arthropathy.  He has pain across both shoulders and has stenosis at C5 C6 but does not appear to have significant signs of myelopathy.  He continues to have severe back and bilateral lower extremity pain which has persisted without improvement despite further conservative management efforts.  He wants to go ahead with surgery and feels he cannot stand to continue as he is as he says he has no quality of life and that he has to lay down to get any relief.  I examined him and his exam is unchanged.  I have given him prescriptions for tramadol and tizanidine.  We will proceed with lumbar decompression and fusion as previously planned at the L4-L5 level for grade 2 spondylolisthesis.      Medical/Surgical/Interim History Reviewed, no change.  Last detailed document date:05/24/2013.   PAST MEDICAL HISTORY, SURGICAL HISTORY, FAMILY HISTORY, SOCIAL HISTORY  AND REVIEW OF SYSTEMS I have reviewed the patient's past medical, surgical, family and social history as well as the comprehensive review of systems as included on the [facility] history form dated 05/24/2013, which I have signed.  Family History: Reviewed, no changes.  Last detailed document: 05/24/2013.   Social History: Tobacco use reviewed. Reviewed, no changes. Last detailed document date: 05/24/2013.      MEDICATIONS(added, continued or stopped this visit):   Started Medication Directions Instruction Stopped   alprazolam 1 mg tablet take 1 tablet by oral route as needed     Amphetamine Salt Combo 20 mg tablet 2.5 tablets daily     levothyroxine 150 mcg tablet take 1 tablet by oral route  every day     meloxicam 15 mg tablet take 1 tablet by oral route  every day    10/12/2013 Percocet 10 mg-325 mg tablet take 1 tablet by oral route  every 6 hours as needed  10/16/2013  10/16/2013 Percocet 10 mg-325 mg tablet take 1 tablet by oral route  every 6 hours as needed     temazepam 30 mg capsule take 1 capsule by oral route 2 times every day at bedtime as needed    10/16/2013 tizanidine 2 mg tablet take 1-2 tabs TID prn spasm    10/16/2013 tramadol 50 mg tablet take 1 tablet by oral route  every 6 hours as needed      ALLERGIES:  Ingredient Reaction Medication Name Comment  NO KNOWN ALLERGIES     No known allergies.   Vitals Date Temp F BP Pulse Ht In Wt Lb BMI BSA Pain Score  10/16/2013  155/104 85 71 237 33.05  4/10        IMPRESSION Patient has neck pain and cervical spondylosis but also has persistent significant bilateral lower extremity pain and low back pain with grade 2 spondylolisthesis of L4 and L5 with severe spinal stenosis.  Completed Orders (this encounter) Order Details Reason Side Interpretation Result Initial Treatment Date Region  Lifestyle education regarding diet Encouraged to eat a well balanced diet and follow up with  primary care physician.        Hypertension education Continue to monitor blood pressure. If remains elevated, contact primary care physician.         Assessment/Plan # Detail Type Description   1. Assessment Acquired spondylolisthesis (738.4).       2. Assessment Lumbago (724.2).       3. Assessment Lumbar radiculopathy (724.4).       4. 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       5. Assessment BMI 33.0-33.9,ADULT (V85.33).   Plan Orders Today's instructions / counseling include(s) Lifestyle education regarding diet.       6. Assessment Abnormal findings, elevated BP w/o HTN (796.2).         Pain Assessment/Treatment Pain Scale: 4/10. Method: Numeric Pain Intensity Scale. Location: back. Onset: 01/07/2013. Duration: varies. Quality: discomforting. Pain Assessment/Treatment follow-up plan of care: Patient is currently taking medication for pain as prescribed..  I recommended proceeding with L4 L5 MIS PLIF.  Risks and benefits were discussed in detail with the patient and he wishes to proceed.  Orders: Instruction(s)/Education: Assessment Instruction  796.2 Hypertension education  V85.33 Lifestyle education regarding diet    MEDICATIONS PRESCRIBED TODAY    Rx Quantity Refills  TRAMADOL HCL 50 mg  120 0  TIZANIDINE HCL 2 mg  60 0  PERCOCET 10 mg-325 mg  60 0            Provider:  Danae Orleans. Venetia Maxon MD  10/21/2013 06:42 PM Dictation edited by: Danae Orleans. Venetia Maxon    CC Providers: Maeola Harman MD [address] [place], [place] [address] ----------------------------------------------------------------------------------------------------------------------------------------------------------------------         Electronically signed by Danae Orleans. Venetia Maxon MD on 10/21/2013 06:42 PM  > [address] [place], [place] [address] Phone: [phone]   Patient ID:   [id number] Patient: Favor Hackler  Date  of Birth: December 12, 1955 Visit Type: Office Visit   Date: 05/24/2013 09:00 AM Provider: Danae Orleans. Venetia Maxon MD   This 58 year old male presents for back pain and neck pain.  History of Present Illness: 1.  back pain  2.  neck pain  Lorrine Kin, [id number]. 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  He reports lumbar surgery in the '80's, followed by years of good health including powerlifting and bodybuilding.  In 2014, he began experiencing numbness in his shoulders and hands.  Chiropractic treatments helped, but resulted in increased lumbar pain.    Mobic  daily - little help  Chiropractic 2014 Lumbar ESI Dec 2014 - no help   Hx: Thyroid, anxiety, AADD SxHx:  Bilat CTR 2015; Lumbar ('80's); Biceps tendon, triceps tendon, knee  MRI uploaded, X-rays on [facility]  Patient says that neck pain is not as severe but low back pain ranges from 7-8 out of 10 in severity.  He notes numbness in his right foot.  He is not getting relief with conservative management.  He spoke to Dr. at [facility] who recommended either L4 L5 laminectomy or fusion and the patient was concerned about the Friday recommendations given.  He notes a history of lumbar discectomy and I believe this was at L5-S1 on the right based on review of his outside radiographs.        PAST MEDICAL/SURGICAL HISTORY   (Detailed)  Disease/disorder Onset Date Management Date Comments    Surgery, lumbar spine 1980     Knee surgery 1994     Wrist surgery      Carpal tunnel release 2015   Anxiety      Arthritis          PAST MEDICAL HISTORY, SURGICAL HISTORY, FAMILY HISTORY, SOCIAL HISTORY AND REVIEW OF SYSTEMS I have reviewed the patient's past medical, surgical, family and social history as well as the comprehensive review of systems as included on the [facility] history form dated 05/24/2013, which I have  signed.  Family History  (Detailed)  Relationship Family Member Name Deceased Age at Death Condition Onset Age Cause of Death      Family history of Hypertension  N      Family history of COPD  N   SOCIAL HISTORY  (Detailed) Tobacco use reviewed. Preferred language is Unknown.   Smoking status: Never smoker.  SMOKING STATUS Use Status Type Smoking Status Usage Per Day Years Used Total Pack Years  no/never  Never smoker       HOME ENVIRONMENT/SAFETY The patient has not fallen in the last year.        MEDICATIONS(added, continued or stopped this visit):   Started Medication Directions Instruction Stopped   alprazolam 1 mg tablet take 1 tablet by oral route as needed     Amphetamine Salt Combo 20 mg tablet 2.5 tablets daily     levothyroxine 150 mcg tablet take 1 tablet by oral route  every day     meloxicam 15 mg tablet take 1 tablet by oral route  every day    05/24/2013 Percocet 10 mg-325 mg tablet take 1 tablet by oral route  every 6 hours as needed     temazepam 30 mg capsule take 1 capsule by oral route 2 times every day at bedtime as needed      ALLERGIES:  Ingredient Reaction Medication Name Comment  NO KNOWN ALLERGIES     No known allergies.  REVIEW OF SYSTEMS System Neg/Pos Details  Constitutional Negative Chills, fatigue, fever, malaise, night sweats, weight gain and weight loss.  ENMT Negative Ear drainage, hearing loss, nasal drainage, otalgia, sinus pressure and sore throat.  Eyes Negative Eye discharge, eye pain and vision changes.  Respiratory Negative Chronic cough, cough, dyspnea, known TB exposure and wheezing.  Cardio Negative Chest pain, claudication, edema and irregular heartbeat/palpitations.  GI Negative Abdominal pain, blood in stool, change in stool pattern, constipation, decreased appetite, diarrhea, heartburn, nausea and vomiting.  GU Negative Dribbling, dysuria, erectile dysfunction, hematuria, polyuria, slow stream, urinary frequency,  urinary incontinence and urinary retention.  Endocrine Negative Cold intolerance, heat intolerance, polydipsia and polyphagia.  Neuro Positive Numbness in extremity.  Psych Positive Anxiety.  Integumentary Negative Brittle hair, brittle nails, change in shape/size of mole(s), hair loss, hirsutism, hives, pruritus, rash and skin lesion.  MS Positive Back pain, Neck pain.  Hema/Lymph Negative Easy bleeding, easy bruising and lymphadenopathy.  Allergic/Immuno Negative Contact allergy, environmental allergies, food allergies and seasonal allergies.  Reproductive Negative Penile discharge and sexual dysfunction.    Vitals Date Temp F BP Pulse Ht In Wt Lb BMI BSA Pain Score  05/24/2013  138/86 90 71 243 33.89  6/10     PHYSICAL EXAM General Level of Distress: no acute distress Overall Appearance: normal    Cardiovascular Cardiac: regular rate and rhythm without murmur  Respiratory Lungs: clear to auscultation  Neurological Recent and Remote Memory: normal Attention Span and Concentration:   normal Language: normal Fund of Knowledge: normal  Right Left Sensation: normal normal Upper Extremity Coordination: normal normal  Lower Extremity Coordination: normal normal  Musculoskeletal Gait and Station: normal  Right Left Upper Extremity Muscle Strength: normal normal Lower Extremity Muscle Strength: normal normal Upper Extremity Muscle Tone:  normal normal Lower Extremity Muscle Tone: normal normal  Motor Strength Upper and lower extremity motor strength was tested in the clinically pertinent muscles.    Deep Tendon Reflexes  Right Left Biceps: normal normal Triceps: normal normal Brachiloradialis: normal normal Patellar: normal normal Achilles: normal normal  Sensory Sensation was tested at L1 to S1.   Cranial Nerves II. Optic Nerve/Visual Fields: normal III. Oculomotor: normal IV. Trochlear: normal V. Trigeminal: normal VI. Abducens: normal VII.  Facial: normal VIII. Acoustic/Vestibular: normal IX. Glossopharyngeal: normal X. Vagus: normal XI. Spinal Accessory: normal XII. Hypoglossal: normal  Motor and other Tests Lhermittes: negative Rhomberg: negative    Right Left Hoffman's: normal normal Clonus: normal normal Babinski: normal normal SLR: positive at 30 degrees positive at 45 degrees Patrick's Pearlean Brownie): negative negative Toe Walk: normal normal Toe Lift: normal normal Heel Walk: normal normal SI Joint: nontender nontender   Additional Findings:  Patient has low back pain to the left side of his lumbar spine.  He says it is worse with standing and working.  He says he cannot lay down at night.  He is able to stand on his heels and toes and then to touch his toes.  He has pain over both greater trochanters to palpation.  He does get some relief leaning forward.    DIAGNOSTIC RESULTS Cervical radiographs were reviewed which demonstrate arthritic changes at C5 C6 and C6 C7 levels and also the left C3 C4 level.  Lumbar MRI demonstrates severe stenosis at L4 L5 with spondylolisthesis at L4-L5 10 mm in extension 10.5 mm on flexion and 10 mm a neutral lateral radiograph.  This represents grade 2 spondylolisthesis.  There does not appear to be evidence of pars defect.    IMPRESSION Patient has severe low back pain which is unrelenting despite conservative management and he has a significant spondylolisthesis of L4 and L5 with severe stenosis at this level.  I have recommended that he undergo surgical intervention due to the severity and  unrelenting nature of his pain.  This will consist of MAS PLIF at the L4 L5 level.  Completed Orders (this encounter) Order Details Reason Side Interpretation Result Initial Treatment Date Region  Lifestyle education regarding diet follow up with primary care physician.        Cervial Spine- AP/Lat/Obls/Odontoid/Flex/Ex 1 of 2     05/24/2013 All Levels to All Levels  Lumbar Spine-  AP/Lat/Obls/Spot/Flex/Ex 2 of 2     05/24/2013 All Levels to All Levels   Assessment/Plan # Detail Type Description   1. Assessment BMI 33.0-33.9,ADULT (V85.33).   Plan Orders Today's instructions / counseling include(s) Lifestyle education regarding diet.       2. Assessment Acquired spondylolisthesis (738.4).       3. 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       4. Assessment Lumbar radiculopathy (724.4).       5. Assessment Lumbago (724.2).         Pain Assessment/Treatment Pain Scale: 6/10. Method: Numeric Pain Intensity Scale. Location: back/neck. Onset: 01/07/2013. Duration: varies. Quality: aching. Pain Assessment/Treatment follow-up plan of care: Patient not taking pain medication..  Fall Risk Plan The patient has not fallen in the last year.  Patient wishes to proceed with surgery.  Risks and benefits were discussed in detail.  He was fitted for an LSO brace.  Orders: Diagnostic Procedures: Assessment Procedure   Cervial Spine- AP/Lat/Obls/Odontoid/Flex/Ex   Lumbar Spine- AP/Lat/Obls/Spot/Flex/Ex  738.4 MAS PLIF L 45  Instruction(s)/Education: Assessment Instruction  V85.33 Lifestyle education regarding diet    MEDICATIONS PRESCRIBED TODAY    Rx Quantity Refills  PERCOCET 10 mg-325 mg  60 0            Provider:  Danae Orleans. Venetia Maxon MD  05/27/2013 07:16 PM Dictation edited by: Danae Orleans. Venetia Maxon    CC Providers: Maeola Harman MD [address] [place], [place] [address]  ----------------------------------------------------------------------------------------------------------------------------------------------------------------------         Electronically signed by Danae Orleans. Venetia Maxon MD on 05/27/2013 07:16 PM

## 2013-11-21 NOTE — Anesthesia Preprocedure Evaluation (Addendum)
Anesthesia Evaluation  Patient identified by MRN, date of birth, ID band Patient awake    Reviewed: Allergy & Precautions, H&P , NPO status , Patient's Chart, lab work & pertinent test results  History of Anesthesia Complications (+) PONV  Airway Mallampati: II TM Distance: >3 FB Neck ROM: Full    Dental no notable dental hx. (+) Teeth Intact, Dental Advisory Given   Pulmonary neg pulmonary ROS,  breath sounds clear to auscultation  Pulmonary exam normal       Cardiovascular negative cardio ROS  Rhythm:Regular Rate:Normal     Neuro/Psych Anxiety negative neurological ROS     GI/Hepatic negative GI ROS, Neg liver ROS,   Endo/Other  Hypothyroidism   Renal/GU negative Renal ROS  negative genitourinary   Musculoskeletal  (+) Arthritis -, Osteoarthritis,    Abdominal   Peds  Hematology negative hematology ROS (+)   Anesthesia Other Findings   Reproductive/Obstetrics negative OB ROS                          Anesthesia Physical Anesthesia Plan  ASA: II  Anesthesia Plan: General   Post-op Pain Management:    Induction: Intravenous  Airway Management Planned: Oral ETT  Additional Equipment:   Intra-op Plan:   Post-operative Plan: Extubation in OR  Informed Consent: I have reviewed the patients History and Physical, chart, labs and discussed the procedure including the risks, benefits and alternatives for the proposed anesthesia with the patient or authorized representative who has indicated his/her understanding and acceptance.   Dental advisory given  Plan Discussed with: CRNA  Anesthesia Plan Comments:         Anesthesia Quick Evaluation

## 2013-11-21 NOTE — Brief Op Note (Signed)
11/21/2013  3:10 PM  PATIENT:  Nicholas Morse  58 y.o. male  PRE-OPERATIVE DIAGNOSIS:  Spondylolisthesis, recurrent lumbarStenosis, herniated lumbar disc,Radiculopathy, Lumbago L 45 and L 5 S 1 levels  POST-OPERATIVE DIAGNOSIS:  Spondylolisthesis, recurrent lumbarStenosis, herniated lumbar disc,Radiculopathy, Lumbago L 45 and L 5 S 1 levels   PROCEDURE:  Procedure(s): FOR MAXIMUM ACCESS (MAS) POSTERIOR LUMBAR INTERBODY FUSION LUMBAR FOUR-FIVE, LUMBAR FIVE-SACRAL ONE WITH REDO DECOMPRESSION (N/A) with PEEK interbody fusion, pedicle screw fixation and posterolateral arthrodesis  Decompression in excess of standard PLIF procedure with painstaking dissection through scar of previously decompressed neural elements (bilateral L 4, L 5, S 1)  SURGEON:  Surgeon(s) and Role:    * Kasen Adduci, MD - Primary    * Henry Elsner, MD - Assisting  PHYSICIAN ASSISTANT:   ASSISTANTS: Poteat, RN   ANESTHESIA:   general  EBL:  Total I/O In: 2300 [I.V.:2300] Out: 1215 [Urine:715; Blood:500]  BLOOD ADMINISTERED:none  DRAINS: none   LOCAL MEDICATIONS USED:  MARCAINE     SPECIMEN:  No Specimen  DISPOSITION OF SPECIMEN:  N/A  COUNTS:  YES  TOURNIQUET:  * No tourniquets in log *  DICTATION: Patient is a 58-year-old with spondylosis , recurrent stenosis, spondylolisthesis, disc herniation and severe back and right lower extremity pain at L4/5 and L 5 S 1 levels of the lumbar spine. It was elected to take him to surgery for redo decompression, MASPLIF L 45 and L 5 S 1 levels with posterolateral arthrodesis.  Procedure:   Following uncomplicated induction of GETA, and placement of electrodes for neural monitoring, patient was turned into a prone position on the Jackson tableand using AP  fluoroscopy the area of planned incision was marked, prepped with betadine scrub and Duraprep, then draped. Exposure was performed of facet joint complex at L 45 and L 5 S 1 level and the MAS retractor was placed.5.5 x  35 mm cortical Nuvasive screws were placed at L 4 bilaterally according to standard landmarks using neural monitoring.  A total laminectomy of L 4 and L 5 was then performed with disarticulation of facets.  This was previously operated with abundant scar tissue and all neural elements were carefully dissected free from investing scar tissue.  Decompression waas far more extensive than that typically performed with standard PLIF procedure with dissection of all scarred in neural elements (bilateral L 4, L 5, S 1 nerve roots).This bone was saved for grafting, combined with Osteocel after being run through bone mill and was placed in bone packing device.  Thorough discectomy was performed bilaterally at L 45 and L 5 S 1 and the endplates were prepared for grafting.  23 x 9 x 8 degree cages were placed in the interspace and positioning was confirmed with AP and lateral fluoroscopy.  10 cc of autograft/Osteocel was packed in the interspace medial to the second cage at each level.   Remaining screws were placed at L 5 and S 1  and 50 mm rods were placed.   And the screws were locked and torqued. Final Xrays showed well positioned implants and screw fixation. The posterolateral region was packed with remaining 30 cc of autograft on the right of midline. The wounds were irrigated and then closed with 1, 2-0 and 3-0 Vicryl stitches. Sterile occlusive dressing was placed with Dermabond and an occlusive dressing. The patient was then extubated in the operating room and taken to recovery in stable and satisfactory condition having tolerated her operation well. Counts were correct at the   end of the case.  PLAN OF CARE: Admit to inpatient   PATIENT DISPOSITION:  PACU - hemodynamically stable.   Delay start of Pharmacological VTE agent (>24hrs) due to surgical blood loss or risk of bleeding: yes

## 2013-11-22 MED ORDER — HYDROCODONE-ACETAMINOPHEN 10-325 MG PO TABS: 1.0000 | ORAL_TABLET | ORAL | Status: AC | PRN

## 2013-11-22 MED ORDER — DIAZEPAM 5 MG PO TABS: 5.0000 mg | ORAL_TABLET | Freq: Four times a day (QID) | ORAL | Status: AC | PRN

## 2013-11-22 MED FILL — Sodium Chloride IV Soln 0.9%: INTRAVENOUS | Qty: 1000 | Status: AC

## 2013-11-22 MED FILL — Heparin Sodium (Porcine) Inj 1000 Unit/ML: INTRAMUSCULAR | Qty: 30 | Status: AC

## 2013-11-22 NOTE — Progress Notes (Signed)
Subjective: Patient reports feeling great.  Wants to go home.  Objective: Vital signs in last 24 hours: Temp:  [97.7 F (36.5 C)-98.4 F (36.9 C)] 98.4 F (36.9 C) (09/16 0406) Pulse Rate:  [77-96] 83 (09/16 0406) Resp:  [11-25] 18 (09/16 0406) BP: (113-141)/(57-88) 116/63 mmHg (09/16 0406) SpO2:  [72 %-100 %] 100 % (09/16 0406)  Intake/Output from previous day: 09/15 0701 - 09/16 0700 In: 2660 [P.O.:360; I.V.:2300] Out: 2265 [Urine:1765; Blood:500] Intake/Output this shift:    Physical Exam: Full strength.  Dressing CDI.  No numbness.    Lab Results:  Recent Labs  11/20/13 0859  WBC 7.4  HGB 17.4*  HCT 48.5  PLT 207   BMET  Recent Labs  11/20/13 0859  NA 136*  K 4.6  CL 98  CO2 27  GLUCOSE 134*  BUN 16  CREATININE 0.98  CALCIUM 9.8    Studies/Results: Dg Lumbar Spine 2-3 Views  11/21/2013   CLINICAL DATA:  L4-5 L5-1 PLIF  EXAM: LUMBAR SPINE - 2-3 VIEW  COMPARISON:  None.  FINDINGS: Interbody fusion is noted at L4-5 and L5-S1 with pedicular screws at all 3 levels. 1 min 27 seconds of fluoroscopy was utilized.   Electronically Signed   By: Alcide Clever M.D.   On: 11/21/2013 15:30    Assessment/Plan: Doing well.  D/C home.    LOS: 1 day    Dorian Heckle, MD 11/22/2013, 7:57 AM

## 2013-11-22 NOTE — Progress Notes (Signed)
Occupational Therapy Treatment Patient Details Name: Nicholas Morse MRN: [id number] DOB: March 08, 1956 Today's Date: 11/22/2013    History of present illness 58 y.o. s/p MAXIMUM ACCESS (MAS) POSTERIOR LUMBAR INTERBODY FUSION LUMBAR FOUR-FIVE, LUMBAR FIVE-SACRAL ONE WITH REDO DECOMPRESSION   OT comments  All education is complete and patient indicates understanding. Pt is adequate level for d/c home at this time from OT standpoint. Ot to d/c from acute caseload.   Follow Up Recommendations  No OT follow up;Supervision - Intermittent    Equipment Recommendations  3 in 1 bedside comode    Recommendations for Other Services      Precautions / Restrictions Precautions Precautions: Back Required Braces or Orthoses: Spinal Brace Spinal Brace: Lumbar corset;Applied in sitting position;Applied in standing position Restrictions Weight Bearing Restrictions: No       Mobility Bed Mobility Overal bed mobility: Modified Independent             General bed mobility comments: educated don brace when EOB and not to sleep in brace  Transfers Overall transfer level: Modified independent                    Balance                                   ADL Overall ADL's : Needs assistance/impaired Eating/Feeding: Independent;Sitting   Grooming: Wash/dry hands;Wash/dry face;Oral care;Modified independent   Upper Body Bathing: Modified independent;Sitting   Lower Body Bathing: Min guard;Sit to/from stand (Ae required)         Lower Body Dressing Details (indicate cue type and reason): family plans to purchase AE kit Toilet Transfer: Supervision/safety           Functional mobility during ADLs: Supervision/safety General ADL Comments: Pt educated on pain management (setting alarm at night), reviewed back handout with detail to adls, educated on don brace over head to avoid twisting, educated on home setup and where to purchase AE. PT and wife without further  questions      Vision                     Perception     Praxis      Cognition   Behavior During Therapy: WFL for tasks assessed/performed Overall Cognitive Status: Within Functional Limits for tasks assessed                       Extremity/Trunk Assessment               Exercises     Shoulder Instructions       General Comments      Pertinent Vitals/ Pain       Pain Assessment: No/denies pain  Home Living                                          Prior Functioning/Environment              Frequency Min 2X/week     Progress Toward Goals  OT Goals(current goals can now be found in the care plan section)  Progress towards OT goals: Progressing toward goals  Acute Rehab OT Goals Patient Stated Goal: not stated OT Goal Formulation: With patient Time For Goal Achievement: 11/28/13 Potential to Achieve Goals: Good ADL Goals Pt  Will Perform Lower Body Dressing: with modified independence;with adaptive equipment;sit to/from stand (with AE met) Pt Will Transfer to Toilet: with modified independence;ambulating (met) Additional ADL Goal #1: Pt will independently don/doff back brace. (met) Additional ADL Goal #2: Pt will independently verbalize and demonstrate 3/3 back precautions. (met)  Plan Discharge plan remains appropriate    Co-evaluation                 End of Session Equipment Utilized During Treatment: Back brace   Activity Tolerance Patient tolerated treatment well   Patient Left in chair;with call bell/phone within reach;with family/visitor present   Nurse Communication Mobility status;Precautions        Time: 1561-5379 OT Time Calculation (min): 38 min  Charges: OT General Charges $OT Visit: 1 Procedure OT Treatments $Self Care/Home Management : 38-52 mins  Parke Poisson B 11/22/2013, 8:46 AM Pager: [phone]

## 2013-11-22 NOTE — Progress Notes (Signed)
Utilization Review Completed.Nicholas Morse T9/16/2015  

## 2013-11-22 NOTE — Discharge Summary (Signed)
Physician Discharge Summary  Patient ID: Nicholas Morse MRN: [id number] DOB/AGE: 1955-11-30 58 y.o.  Admit date: 11/21/2013 Discharge date: 11/22/2013  Admission Diagnoses:Lumbar spondylolisthesis, stenosis, herniated disc, radiculopathy L 45 and L 5 S 1  Discharge Diagnoses: Lumbar spondylolisthesis, stenosis, herniated disc, radiculopathy L 45 and L 5 S 1 Active Problems:   Spondylolisthesis of lumbar region   Discharged Condition: good  Hospital Course: Patient underwent redo laminectomy with PLIF, pedicle screw fixation and posterolateral arthrodesis.  He did well and was discharged home on POD 1.  Consults: None  Significant Diagnostic Studies: None  Treatments: surgery: redo laminectomy with PLIF, pedicle screw fixation and posterolateral arthrodesis  Discharge Exam: Blood pressure 116/63, pulse 83, temperature 98.4 F (36.9 C), temperature source Oral, resp. rate 18, height  (1.778 m), weight 105.178 kg (231 lb 14 oz), SpO2 100.00%. Neurologic: Alert and oriented X 3, normal strength and tone. Normal symmetric reflexes. Normal coordination and gait Wound:CDI  Disposition: Home     Medication List         albuterol 108 (90 BASE) MCG/ACT inhaler  Commonly known as:  PROVENTIL HFA;VENTOLIN HFA  Inhale 1 puff into the lungs every 6 (six) hours as needed for wheezing or shortness of breath.     ALPRAZolam 1 MG tablet  Commonly known as:  XANAX  Take 1 mg by mouth 2 (two) times daily as needed for anxiety.     amphetamine-dextroamphetamine 20 MG tablet  Commonly known as:  ADDERALL  Take 20 mg by mouth 2 (two) times daily.     ANDROGEL PUMP 20.25 MG/ACT (1.62%) Gel  Generic drug:  Testosterone  Place 10 g onto the skin daily.     diazepam 5 MG tablet  Commonly known as:  VALIUM  Take 1 tablet (5 mg total) by mouth every 6 (six) hours as needed for anxiety or muscle spasms.     HYDROcodone-acetaminophen 5-325 MG per tablet  Commonly known as:  NORCO/VICODIN   Take 1 tablet by mouth 2 (two) times daily as needed for moderate pain.     HYDROcodone-acetaminophen 10-325 MG per tablet  Commonly known as:  NORCO  Take 1-2 tablets by mouth every 4 (four) hours as needed for moderate pain.     levothyroxine 150 MCG tablet  Commonly known as:  SYNTHROID, LEVOTHROID  Take 150 mcg by mouth daily before breakfast.     temazepam 30 MG capsule  Commonly known as:  RESTORIL  Take 60 mg by mouth at bedtime.         Signed: Dorian Heckle, MD 11/22/2013, 7:58 AM

## 2013-11-22 NOTE — Progress Notes (Signed)
Patient alert and oriented, mae's well, voiding adequate amount of urine, swallowing without difficulty, c/o moderate pain and meds given prior to discharged. Patient discharged home with family. Script and discharged instructions given to patient. Patient and family stated understanding of instructions given.  

## 2013-11-22 NOTE — Discharge Instructions (Signed)

## 2013-11-22 NOTE — Evaluation (Signed)
Physical Therapy Evaluation Patient Details Name: Nicholas Morse MRN: [id number] DOB: 1955/10/15 Today's Date: 11/22/2013   History of Present Illness  58 y.o. s/p MAXIMUM ACCESS (MAS) POSTERIOR LUMBAR INTERBODY FUSION LUMBAR FOUR-FIVE, LUMBAR FIVE-SACRAL ONE WITH REDO DECOMPRESSION  Clinical Impression  Pt mobilizing well at mod I level, ambulated 400' without AD and practiced flight of stairs. Reviewed precautions and activity progression for home. No further PT needs at this time.    Follow Up Recommendations  outpt if needed after f/u with physician    Equipment Recommendations  3in1 (PT)    Recommendations for Other Services       Precautions / Restrictions Precautions Precautions: Back Precaution Comments: reviewed handout and back precautions, wife is remembering them but pt needs cues, discussed proper posture and activity progression Required Braces or Orthoses: Spinal Brace Spinal Brace: Lumbar corset;Applied in sitting position;Applied in standing position Restrictions Weight Bearing Restrictions: No      Mobility  Bed Mobility Overal bed mobility: Modified Independent Bed Mobility: Rolling;Sidelying to Sit Rolling: Modified independent (Device/Increase time) Sidelying to sit: Modified independent (Device/Increase time)       General bed mobility comments: pt performed bed mobility safely, keeping precautions  Transfers Overall transfer level: Modified independent Equipment used: None Transfers: Sit to/from Stand Sit to Stand: Modified independent (Device/Increase time)            Ambulation/Gait Ambulation/Gait assistance: Modified independent (Device/Increase time) Ambulation Distance (Feet): 400 Feet Assistive device: None Gait Pattern/deviations: WFL(Within Functional Limits) Gait velocity: decreased Gait velocity interpretation: Below normal speed for age/gender General Gait Details: pt with guarded gait but safe without assistive  device  Stairs Stairs: Yes Stairs assistance: Modified independent (Device/Increase time) Stair Management: One rail Left;Alternating pattern;Forwards Number of Stairs: 12 General stair comments: pt safe with stairs, good technique, experiencing decreased pain in legs since before surgery  Wheelchair Mobility    Modified Rankin (Stroke Patients Only)       Balance Overall balance assessment: Modified Independent                                           Pertinent Vitals/Pain Pain Assessment: 0-10 Pain Score: 4  Pain Location: back Pain Descriptors / Indicators: Cramping Pain Intervention(s): Monitored during session    Home Living Family/patient expects to be discharged to:: Private residence Living Arrangements: Spouse/significant other Available Help at Discharge: Family;Available 24 hours/day Type of Home: House Home Access: Stairs to enter Entrance Stairs-Rails: None Entrance Stairs-Number of Steps: 2 Home Layout: Two level Home Equipment: Shower seat - built in      Prior Function Level of Independence: Independent               Hand Dominance   Dominant Hand: Right    Extremity/Trunk Assessment   Upper Extremity Assessment: Defer to OT evaluation;Overall WFL for tasks assessed           Lower Extremity Assessment: Overall WFL for tasks assessed      Cervical / Trunk Assessment: Normal  Communication   Communication: No difficulties  Cognition Arousal/Alertness: Awake/alert Behavior During Therapy: WFL for tasks assessed/performed Overall Cognitive Status: Within Functional Limits for tasks assessed       Memory: Decreased recall of precautions              General Comments General comments (skin integrity, edema, etc.): discussed activity progression and car transfer  Exercises        Assessment/Plan    PT Assessment Patent does not need any further PT services  PT Diagnosis     PT Problem List     PT Treatment Interventions     PT Goals (Current goals can be found in the Care Plan section) Acute Rehab PT Goals Patient Stated Goal: return to home and work PT Goal Formulation: No goals set, d/c therapy    Frequency     Barriers to discharge        Co-evaluation               End of Session Equipment Utilized During Treatment: Back brace Activity Tolerance: Patient tolerated treatment well Patient left: in bed;with call bell/phone within reach;with family/visitor present Nurse Communication: Mobility status         Time: 1610-9604 PT Time Calculation (min): 29 min   Charges:   PT Evaluation $Initial PT Evaluation Tier I: 1 Procedure PT Treatments $Gait Training: 23-37 mins   PT G Codes:        Lyanne Co, PT  Acute Rehab Services  [phone]   Lyanne Co 11/22/2013, 8:50 AM

## 2013-11-23 ENCOUNTER — Encounter (HOSPITAL_COMMUNITY): Payer: Self-pay | Admitting: Neurosurgery

## 2017-06-25 ENCOUNTER — Other Ambulatory Visit: Payer: Self-pay | Admitting: Neurosurgery

## 2017-06-25 DIAGNOSIS — M48061 Spinal stenosis, lumbar region without neurogenic claudication: Secondary | ICD-10-CM

## 2017-07-03 ENCOUNTER — Ambulatory Visit
Admission: RE | Admit: 2017-07-03 | Discharge: 2017-07-03 | Disposition: A | Discharge status: Home / Self Care | Payer: BLUE CROSS/BLUE SHIELD | Source: Ambulatory Visit | Attending: Neurosurgery | Admitting: Neurosurgery

## 2017-07-03 DIAGNOSIS — M48061 Spinal stenosis, lumbar region without neurogenic claudication: Secondary | ICD-10-CM

## 2017-07-03 MED ORDER — GADOBENATE DIMEGLUMINE 529 MG/ML IV SOLN
20.0000 mL | Freq: Once | INTRAVENOUS | Status: AC | PRN
  Administered 2017-07-03: 20 mL via INTRAVENOUS

## 2019-05-18 ENCOUNTER — Other Ambulatory Visit: Payer: Self-pay | Admitting: Neurosurgery

## 2019-05-18 DIAGNOSIS — M5136 Other intervertebral disc degeneration, lumbar region: Secondary | ICD-10-CM

## 2019-06-13 ENCOUNTER — Ambulatory Visit
Admission: RE | Admit: 2019-06-13 | Discharge: 2019-06-13 | Disposition: A | Discharge status: Home / Self Care | Payer: BC Managed Care – PPO | Source: Ambulatory Visit | Attending: Neurosurgery | Admitting: Neurosurgery

## 2019-06-13 ENCOUNTER — Other Ambulatory Visit: Payer: Self-pay

## 2019-06-13 DIAGNOSIS — M5136 Other intervertebral disc degeneration, lumbar region: Secondary | ICD-10-CM

## 2019-06-13 MED ORDER — GADOBENATE DIMEGLUMINE 529 MG/ML IV SOLN
20.0000 mL | Freq: Once | INTRAVENOUS | Status: AC | PRN
  Administered 2019-06-13: 16:00:00 20 mL via INTRAVENOUS

## 2021-05-07 ENCOUNTER — Other Ambulatory Visit (HOSPITAL_COMMUNITY): Payer: Self-pay | Admitting: Neurological Surgery

## 2021-05-07 DIAGNOSIS — M48062 Spinal stenosis, lumbar region with neurogenic claudication: Secondary | ICD-10-CM

## 2021-05-26 ENCOUNTER — Ambulatory Visit (HOSPITAL_COMMUNITY)
Admission: RE | Admit: 2021-05-26 | Discharge: 2021-05-26 | Disposition: A | Discharge status: Home / Self Care | Payer: Medicare Other | Source: Ambulatory Visit | Attending: Neurological Surgery | Admitting: Neurological Surgery

## 2021-05-26 ENCOUNTER — Other Ambulatory Visit: Payer: Self-pay

## 2021-05-26 DIAGNOSIS — M48062 Spinal stenosis, lumbar region with neurogenic claudication: Secondary | ICD-10-CM | POA: Insufficient documentation

## 2021-06-25 ENCOUNTER — Other Ambulatory Visit: Payer: Self-pay | Admitting: Neurological Surgery

## 2021-06-25 ENCOUNTER — Other Ambulatory Visit (HOSPITAL_COMMUNITY): Payer: Self-pay | Admitting: Neurological Surgery

## 2021-06-25 DIAGNOSIS — M47812 Spondylosis without myelopathy or radiculopathy, cervical region: Secondary | ICD-10-CM

## 2021-06-29 ENCOUNTER — Ambulatory Visit (HOSPITAL_COMMUNITY)
Admission: RE | Admit: 2021-06-29 | Discharge: 2021-06-29 | Disposition: A | Discharge status: Home / Self Care | Payer: Medicare Other | Source: Ambulatory Visit | Attending: Neurological Surgery | Admitting: Neurological Surgery

## 2021-06-29 DIAGNOSIS — M47812 Spondylosis without myelopathy or radiculopathy, cervical region: Secondary | ICD-10-CM | POA: Diagnosis not present

## 2021-07-30 ENCOUNTER — Ambulatory Visit (HOSPITAL_BASED_OUTPATIENT_CLINIC_OR_DEPARTMENT_OTHER): Payer: Medicare Other | Attending: Family Medicine | Admitting: Physical Therapy

## 2021-07-30 ENCOUNTER — Encounter (HOSPITAL_BASED_OUTPATIENT_CLINIC_OR_DEPARTMENT_OTHER): Payer: Self-pay | Admitting: Physical Therapy

## 2021-07-30 DIAGNOSIS — R293 Abnormal posture: Secondary | ICD-10-CM | POA: Insufficient documentation

## 2021-07-30 DIAGNOSIS — R2681 Unsteadiness on feet: Secondary | ICD-10-CM | POA: Insufficient documentation

## 2021-07-30 DIAGNOSIS — G894 Chronic pain syndrome: Secondary | ICD-10-CM | POA: Insufficient documentation

## 2021-07-30 NOTE — Therapy (Signed)
OUTPATIENT PHYSICAL THERAPY THORACOLUMBAR EVALUATION   Patient Name: Nicholas Morse MRN: [id number] DOB:04/09/1955, 66 y.o., male Today's Date: 07/30/2021   PT End of Session - 07/30/21 1101     Visit Number 1    Number of Visits 24    Date for PT Re-Evaluation 10/22/21    Authorization Type medicare    Progress Note Due on Visit 10    PT Start Time [phone]    PT Stop Time 1030    PT Time Calculation (min) 44 min    Activity Tolerance Patient tolerated treatment well    Behavior During Therapy WFL for tasks assessed/performed             Past Medical History:  Diagnosis Date   Anxiety    Arthritis    DJD (degenerative joint disease)    Hx of seasonal allergies    PMH   Hypothyroidism    Insomnia    Nocturia    Numbness and tingling    B/LLE   Panic attacks    PONV (postoperative nausea and vomiting)    Past Surgical History:  Procedure Laterality Date   BACK SURGERY     CARPAL TUNNEL RELEASE     B/L   COLONOSCOPY     FRACTURE SURGERY     Right knee   KNEE SURGERY     LCL rupture   MAXIMUM ACCESS (MAS)POSTERIOR LUMBAR INTERBODY FUSION (PLIF) 2 LEVEL N/A 11/21/2013   Procedure: FOR MAXIMUM ACCESS (MAS) POSTERIOR LUMBAR INTERBODY FUSION LUMBAR FOUR-FIVE, LUMBAR FIVE-SACRAL ONE WITH REDO DECOMPRESSION;  Surgeon: Maeola HarmanJoseph Stern, MD;  Location: [facility];  Service: Neurosurgery;  Laterality: N/A;   MUSCLE REPAIR     Bicept and Tricept rupture repair surgery   Patient Active Problem List   Diagnosis Date Noted   Spondylolisthesis of lumbar region 11/21/2013    PCP: Cindie LarocheEberle, Robert, MD  REFERRING PROVIDER: Patrica DuelSchmid, Erin E, NP  REFERRING DIAG: G89.4 (ICD-10-CM) - Chronic pain syndrome  Rationale for Evaluation and Treatment Rehabilitation  THERAPY DIAG:  Chronic pain syndrome  Unsteadiness on feet  Abnormal posture  ONSET DATE: >10 yrs  SUBJECTIVE:                                                                                                                                                                                            SUBJECTIVE STATEMENT: 7-8 years ago lb fusion. Worked well for a few years.  Moved on to injections which worked for a little while as well. "I'm in pretty bad shape right now.  Had a hard time walking from parking lot. " Trouble  getting sock and shoes on. LB hasn't changed much.  May get an ablation in thoracis spine.  Had a block yesterday.  Pain in mid thoracic spine 8/10 prior to block after 4/10.  This am 0/10. No pain in neck but it is stiff PERTINENT HISTORY:  L shoulder rotator cuff tear with bicep tendon rupture without repair >+5 yrs ago. ROM and strength limited Spinal stimulator DM Htn  PAIN:  Are you having pain? Yes: NPRS scale: after injection (yesterday)4/10: max 10/10 Pain location: mid thoracic spine Pain description: stabbing, ache Aggravating factors: bending, twisting, sneezing,walking up to 500 ft Relieving factors: stimulator   PRECAUTIONS: Fall  WEIGHT BEARING RESTRICTIONS No  FALLS:  Has patient fallen in last 6 months? Yes. Number of falls 2  LIVING ENVIRONMENT: Lives with: lives with their family Lives in: House/apartment Stairs: Yes: Internal: 18 steps; can reach both Has following equipment at home: None  OCCUPATION:    PLOF: Independent  PATIENT GOALS Increase flexability   OBJECTIVE:   DIAGNOSTIC FINDINGS:  MRI cervical spine: Cervical Spondylosis 1. 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 2. Mildly worsened moderate right C6-7 neural foraminal stenosis. 3. Unchanged moderate bilateral C3-4 and left C5-6 neural foraminal stenosis. 4. No spinal canal stenosis.  PATIENT SURVEYS:  FOTO 28 with predicted 8 point improvement to 36  SCREENING FOR RED FLAGS: Neg for all  COGNITION:  Overall cognitive status: Within functional limits for tasks assessed     SENSATION: Peripheral neuropathies: bilat feet decreased sensation to light touch;  hot/cold  MUSCLE LENGTH: Hamstrings shortened.  Unable to test due to not tolerating  POSTURE: rounded shoulders, forward head, and decreased lumbar lordosis  PALPATION: Minimally tender lumbar and low thoracic paraspinals upper sacrum  LUMBAR ROM:  Limited 80% in all directions Pain with movement > on LvsR   LOWER EXTREMITY ROM:     WFL  LOWER EXTREMITY MMT:    MMT Right eval Left eval  Hip flexion 4 Seated 4 seated  Hip extension    Hip abduction 5 5  Hip adduction 5 5  Hip internal rotation    Hip external rotation    Knee flexion 5 5  Knee extension 5 5  Ankle dorsiflexion 5 5  Ankle plantarflexion    Ankle inversion    Ankle eversion     (Blank rows = not tested)  LUMBAR SPECIAL TESTS:  Slump test: Negative  FUNCTIONAL TESTS:  5 times sit to stand: 29s Timed up and go (TUG): 20 Berg Balance Scale: 28/56 Berg Balance      07/30/21 Sit to stand 2  Standing unsupported 4  Sitting with back unsupported but feet supported 4  Stand to sit  2  Transfers  3  Standing unsupported with eyes closed 3  Standing unsupported feet together 3  From standing position, reach forward with outstretched arm 2  From standing position, pick up object from floor 1  From standing position, turn and look behind over each shoulder 2  Turn 360 1  Standing unsupported, alternately place foot on step 1  Standing unsupported, one foot in front 0  Standing on one leg 0  Total:  28       GAIT: Distance walked: 500 Assistive device utilized: None Level of assistance: Complete Independence Comments:  TODAY'S TREATMENT  L should rotator cuff tear with bicep tendon rupture   PATIENT EDUCATION:  Education details: anatomy of condition benefits of aquatic therapy, properties of water. Person educated: Patient Education method:  Explanation Education comprehension: verbalized understanding   HOME EXERCISE PROGRAM: To be assigned  ASSESSMENT:  CLINICAL  IMPRESSION: Patient is a 66 y.o. M  who was seen today for physical therapy evaluation and treatment for chronic pain syndrome. Pt with long hx of spinal dysfunction and pain.  He is neg for sciatic involvement and biggest complaint other than pain is unable to tolerate amb for over 5-10 mins.  He has had significant relief from thoracic spinal injection received yesterday decreasing hip pain by 1/2. He is hopeful to have an ablation. He presents today with very limited trunk and hip ROM, hamstrings tight. He will benefit from skilled physical therapy/aquatics using properties of water to facilitate and hasten progression towards meeting goals.   OBJECTIVE IMPAIRMENTS Abnormal gait, decreased activity tolerance, decreased balance, decreased endurance, decreased mobility, difficulty walking, decreased ROM, decreased strength, impaired flexibility, impaired sensation, and pain.   ACTIVITY LIMITATIONS lifting, bending, stairs, and reach over head  PARTICIPATION LIMITATIONS: cleaning, community activity, and yard work  PERSONAL FACTORS Age, Past/current experiences, Time since onset of injury/illness/exacerbation, and 3+ comorbidities: Left rotator cuff and bicep tendon tears, Cervical Spondylosis, DM, HTN, Lumbar fusion, chronic pain  are also affecting patient's functional outcome.   REHAB POTENTIAL: Good  CLINICAL DECISION MAKING: Unstable/unpredictable  EVALUATION COMPLEXITY: High   GOALS: Goals reviewed with patient? Yes  SHORT TERM GOALS: Target date: 08/27/2021  Pt will acquire cane; it will be properly adjusted to height with instruction on coordinated use to improve toleration to amb/less back fatigue Baseline:no cane Goal status: INITIAL  2.  Increased amb distance without  back fatigue 1000 ft to and from aquatics Baseline: 560ft Goal status: INITIAL  3.  Decrease mid thoracic pain< 4/10 Baseline: 4-8/10 Goal status: INITIAL      LONG TERM GOALS: Target date:  10/22/2021  Sharlene Motts balance test to improve to at least 35/56 [facility]) to demonstrate decreased fall risk Baseline: 28/56 Goal status: INITIAL  2.  Foto to predicted 36 or greater Baseline: 28 Goal status: INITIAL  3.  LE hip strength to 5/5 to demonstrate decreased pain in LB and improved strength Baseline: 4/5 limited to pain Goal status: INITIAL  4.  Pt will improve hamstring length to allow for indep and easy and safe donning and doffing of shoes and socks in seated position Baseline: with difficulty in standing position Goal status: INITIAL  5.  Pt will report no falls since onset of skilled physical therapy Baseline: 2 last 5 months Goal status: INITIAL     PLAN: PT FREQUENCY: 1-2x/week  PT DURATION: 12 weeks  PLANNED INTERVENTIONS: Therapeutic exercises, Therapeutic activity, Neuromuscular re-education, Balance training, Gait training, Patient/Family education, Joint mobilization, Stair training, DME instructions, Aquatic Therapy, Electrical stimulation, and Taping.  PLAN FOR NEXT SESSION: aquatic therapy for stretching le and core, strengthening core, balance retraining.   Corrie Dandy Tomma Lightning) Abigaile Rossie MPT 07/30/2021, 6:38 PM

## 2021-08-05 ENCOUNTER — Encounter (HOSPITAL_BASED_OUTPATIENT_CLINIC_OR_DEPARTMENT_OTHER): Payer: Self-pay | Admitting: Physical Therapy

## 2021-08-05 ENCOUNTER — Ambulatory Visit (HOSPITAL_BASED_OUTPATIENT_CLINIC_OR_DEPARTMENT_OTHER): Payer: Medicare Other | Admitting: Physical Therapy

## 2021-08-05 DIAGNOSIS — G894 Chronic pain syndrome: Secondary | ICD-10-CM

## 2021-08-05 DIAGNOSIS — R2681 Unsteadiness on feet: Secondary | ICD-10-CM

## 2021-08-05 DIAGNOSIS — R293 Abnormal posture: Secondary | ICD-10-CM

## 2021-08-05 NOTE — Therapy (Signed)
OUTPATIENT PHYSICAL THERAPY TREATMENT NOTE   Patient Name: Nicholas Morse MRN: [id number] DOB:04/05/1955, 66 y.o., male Today's Date: 08/05/2021   PT End of Session - 08/05/21 0802     Visit Number 2    Number of Visits 24    Date for PT Re-Evaluation 10/22/21    Authorization Type medicare    PT Start Time 0800    PT Stop Time 0846    PT Time Calculation (min) 46 min    Activity Tolerance Patient tolerated treatment well    Behavior During Therapy WFL for tasks assessed/performed             Past Medical History:  Diagnosis Date   Anxiety    Arthritis    DJD (degenerative joint disease)    Hx of seasonal allergies    PMH   Hypothyroidism    Insomnia    Nocturia    Numbness and tingling    B/LLE   Panic attacks    PONV (postoperative nausea and vomiting)    Past Surgical History:  Procedure Laterality Date   BACK SURGERY     CARPAL TUNNEL RELEASE     B/L   COLONOSCOPY     FRACTURE SURGERY     Right knee   KNEE SURGERY     LCL rupture   MAXIMUM ACCESS (MAS)POSTERIOR LUMBAR INTERBODY FUSION (PLIF) 2 LEVEL N/A 11/21/2013   Procedure: FOR MAXIMUM ACCESS (MAS) POSTERIOR LUMBAR INTERBODY FUSION LUMBAR FOUR-FIVE, LUMBAR FIVE-SACRAL ONE WITH REDO DECOMPRESSION;  Surgeon: Maeola Harman, MD;  Location: [facility];  Service: Neurosurgery;  Laterality: N/A;   MUSCLE REPAIR     Bicept and Tricept rupture repair surgery   Patient Active Problem List   Diagnosis Date Noted   Spondylolisthesis of lumbar region 11/21/2013     PCP: Cindie Laroche, MD   REFERRING PROVIDER: Patrica Duel, NP   REFERRING DIAG: G89.4 (ICD-10-CM) - Chronic pain syndrome   Rationale for Evaluation and Treatment Rehabilitation   THERAPY DIAG:  Chronic pain syndrome   Unsteadiness on feet   Abnormal posture   ONSET DATE: >10 yrs   SUBJECTIVE:                                                                                                                                                                                             SUBJECTIVE STATEMENT: Pt reports he had quite a bit of relief from nerve block (in L2-4).  No other changes since eval.    PERTINENT HISTORY:  L shoulder rotator cuff tear with bicep tendon rupture without repair >+5 yrs ago. ROM and strength limited  Spinal stimulator DM Htn   PAIN:  Are you having pain? Yes: NPRS scale: 5/10 Pain location: lower thoracic spine/lumbar Pain description: stabbing, ache, tight Aggravating factors: bending, twisting, sneezing,walking up to 500 ft Relieving factors: stimulator     PRECAUTIONS: Fall   WEIGHT BEARING RESTRICTIONS No   FALLS:  Has patient fallen in last 6 months? Yes. Number of falls 2   LIVING ENVIRONMENT: Lives with: lives with their family Lives in: House/apartment Stairs: Yes: Internal: 18 steps; can reach both Has following equipment at home: None   OCCUPATION:      PLOF: Independent   PATIENT GOALS Increase flexability     [facility]  [facility]   DIAGNOSTIC FINDINGS:  MRI cervical spine: Cervical Spondylosis 1. 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 2. Mildly worsened moderate right C6-7 neural foraminal stenosis. 3. Unchanged moderate bilateral C3-4 and left C5-6 neural foraminal stenosis. 4. No spinal canal stenosis.   PATIENT SURVEYS:  FOTO 28 with predicted 8 point improvement to 36   SCREENING FOR RED FLAGS: Neg for all   COGNITION:            Overall cognitive status: Within functional limits for tasks assessed                          SENSATION: Peripheral neuropathies: bilat feet decreased sensation to light touch; hot/cold   MUSCLE LENGTH: Hamstrings shortened.  Unable to test due to not tolerating   POSTURE: rounded shoulders, forward head, and decreased lumbar lordosis   PALPATION: Minimally tender lumbar and low thoracic paraspinals upper sacrum   LUMBAR ROM:  Limited 80% in all directions  Pain with movement > on LvsR     LOWER EXTREMITY ROM:                WFL   LOWER EXTREMITY MMT:     MMT Right eval Left eval  Hip flexion 4 Seated 4 seated  Hip extension      Hip abduction 5 5  Hip adduction 5 5  Hip internal rotation      Hip external rotation      Knee flexion 5 5  Knee extension 5 5  Ankle dorsiflexion 5 5  Ankle plantarflexion      Ankle inversion      Ankle eversion       (Blank rows = not tested)   LUMBAR SPECIAL TESTS:  Slump test: Negative   FUNCTIONAL TESTS:  5 times sit to stand: 29s Timed up and go (TUG): 20 Berg Balance Scale: 28/56  Berg Balance                                                              07/30/21 Sit to stand 2  Standing unsupported 4  Sitting with back unsupported but feet supported 4  Stand to sit  2  Transfers  3  Standing unsupported with eyes closed 3  Standing unsupported feet together 3  From standing position, reach forward with outstretched arm 2  From standing position, pick up object from floor 1  From standing position, turn and look behind over each shoulder 2  Turn 360 1  Standing unsupported, alternately place foot on  step 1  Standing unsupported, one foot in front 0  Standing on one leg 0  Total:  28          GAIT: Distance walked: 500 Assistive device utilized: None Level of assistance: Complete Independence Comments:  TODAY'S TREATMENT  Pt seen for aquatic therapy today.  Treatment took place in water 3.25-4.8 ft in depth at the [facility] pool. Temp of water was 91.  Pt entered/exited the pool via stairs independently with bilat rail. Into to aquatic environment Warm up of laps both forward and backward walking, side stepping Tandem gait forward / backward  Holding wall:  squats x 10 On bench with blue step under feet:  STS without UE support x 10 Seated on 4th step:  hamstring stretch x 15 sec x 2 reps each LE; fig 4 stretch (very difficult/painful in hip/ tight);  holding  rails - flutter kick x 10, hip abdct/add x 10 - 2 sets;  lumbar flexion stretch with arms on kickboard x 3 reps, repeated with lateral flexion x 3 each side, cues to relax onto board. Forward/ backward high knee marching in 4.8 ft water; repeated with forward punch   Pt requires buoyancy for support and to offload joints with strengthening exercises. Viscosity of the water is needed for resistance of strengthening; water current perturbations provides challenge to standing balance unsupported, requiring increased core activation.     PATIENT EDUCATION:  Education details: benefits of aquatic therapy, properties of water. Person educated: Patient Education method: Explanation Education comprehension: verbalized understanding     HOME EXERCISE PROGRAM: To be assigned   ASSESSMENT:   CLINICAL IMPRESSION: Pt is confident in the aquatic environment; able to take cues / direction from therapist on deck.  He reported some increase in stiffness initially with exercises; reduced with cues for relaxation and posture. Encouraged pt to complete seated hamstring stretch outside of therapy sessions at least 1x/day and to trial hooklying piriformis stretch.  He will benefit from skilled physical therapy/aquatics using properties of water to facilitate and hasten progression towards meeting goals.     OBJECTIVE IMPAIRMENTS Abnormal gait, decreased activity tolerance, decreased balance, decreased endurance, decreased mobility, difficulty walking, decreased ROM, decreased strength, impaired flexibility, impaired sensation, and pain.    ACTIVITY LIMITATIONS lifting, bending, stairs, and reach over head   PARTICIPATION LIMITATIONS: cleaning, community activity, and yard work   PERSONAL FACTORS Age, Past/current experiences, Time since onset of injury/illness/exacerbation, and 3+ comorbidities: Left rotator cuff and bicep tendon tears, Cervical Spondylosis, DM, HTN, Lumbar fusion, chronic pain  are also  affecting patient's functional outcome.    REHAB POTENTIAL: Good   CLINICAL DECISION MAKING: Unstable/unpredictable   EVALUATION COMPLEXITY: High     GOALS: Goals reviewed with patient? Yes   SHORT TERM GOALS: Target date: 08/27/2021   Pt will acquire cane; it will be properly adjusted to height with instruction on coordinated use to improve toleration to amb/less back fatigue Baseline:no cane Goal status: INITIAL   2.  Increased amb distance without  back fatigue 1000 ft to and from aquatics Baseline: 555ft Goal status: INITIAL   3.  Decrease mid thoracic pain< 4/10 Baseline: 4-8/10 Goal status: INITIAL           LONG TERM GOALS: Target date: 10/22/2021   Sharlene Motts balance test to improve to at least 35/56 [facility]) to demonstrate decreased fall risk Baseline: 28/56 Goal status: INITIAL   2.  Foto to predicted 36 or greater Baseline: 28 Goal status: INITIAL  3.  LE hip strength to 5/5 to demonstrate decreased pain in LB and improved strength Baseline: 4/5 limited to pain Goal status: INITIAL   4.  Pt will improve hamstring length to allow for indep and easy and safe donning and doffing of shoes and socks in seated position Baseline: with difficulty in standing position Goal status: INITIAL   5.  Pt will report no falls since onset of skilled physical therapy Baseline: 2 last 5 months Goal status: INITIAL         PLAN: PT FREQUENCY: 1-2x/week   PT DURATION: 12 weeks   PLANNED INTERVENTIONS: Therapeutic exercises, Therapeutic activity, Neuromuscular re-education, Balance training, Gait training, Patient/Family education, Joint mobilization, Stair training, DME instructions, Aquatic Therapy, Electrical stimulation, and Taping.   PLAN FOR NEXT SESSION: aquatic therapy for stretching LE and core, strengthening core, balance retraining.  Mayer Camel, PTA 08/05/21 10:03 AM

## 2021-08-08 ENCOUNTER — Encounter (HOSPITAL_BASED_OUTPATIENT_CLINIC_OR_DEPARTMENT_OTHER): Payer: Self-pay | Admitting: Physical Therapy

## 2021-08-08 ENCOUNTER — Ambulatory Visit (HOSPITAL_BASED_OUTPATIENT_CLINIC_OR_DEPARTMENT_OTHER): Payer: Medicare Other | Attending: Family Medicine | Admitting: Physical Therapy

## 2021-08-08 DIAGNOSIS — R2681 Unsteadiness on feet: Secondary | ICD-10-CM | POA: Insufficient documentation

## 2021-08-08 DIAGNOSIS — G894 Chronic pain syndrome: Secondary | ICD-10-CM | POA: Insufficient documentation

## 2021-08-08 DIAGNOSIS — R293 Abnormal posture: Secondary | ICD-10-CM | POA: Insufficient documentation

## 2021-08-08 NOTE — Therapy (Signed)
OUTPATIENT PHYSICAL THERAPY TREATMENT NOTE   Patient Name: Nicholas Morse MRN: [id number] DOB:03/19/1955, 66 y.o., male Today's Date: 08/08/2021   PT End of Session - 08/08/21 1055     Visit Number 3    Number of Visits 24    Date for PT Re-Evaluation 10/22/21    Authorization Type medicare    Progress Note Due on Visit 10    PT Start Time 1054    PT Stop Time 1135    PT Time Calculation (min) 41 min    Activity Tolerance Patient tolerated treatment well    Behavior During Therapy WFL for tasks assessed/performed             Past Medical History:  Diagnosis Date   Anxiety    Arthritis    DJD (degenerative joint disease)    Hx of seasonal allergies    PMH   Hypothyroidism    Insomnia    Nocturia    Numbness and tingling    B/LLE   Panic attacks    PONV (postoperative nausea and vomiting)    Past Surgical History:  Procedure Laterality Date   BACK SURGERY     CARPAL TUNNEL RELEASE     B/L   COLONOSCOPY     FRACTURE SURGERY     Right knee   KNEE SURGERY     LCL rupture   MAXIMUM ACCESS (MAS)POSTERIOR LUMBAR INTERBODY FUSION (PLIF) 2 LEVEL N/A 11/21/2013   Procedure: FOR MAXIMUM ACCESS (MAS) POSTERIOR LUMBAR INTERBODY FUSION LUMBAR FOUR-FIVE, LUMBAR FIVE-SACRAL ONE WITH REDO DECOMPRESSION;  Surgeon: Maeola HarmanJoseph Stern, MD;  Location: [facility];  Service: Neurosurgery;  Laterality: N/A;   MUSCLE REPAIR     Bicept and Tricept rupture repair surgery   Patient Active Problem List   Diagnosis Date Noted   Spondylolisthesis of lumbar region 11/21/2013     PCP: Cindie LarocheEberle, Robert, MD   REFERRING PROVIDER: Patrica DuelSchmid, Erin E, NP   REFERRING DIAG: G89.4 (ICD-10-CM) - Chronic pain syndrome   Rationale for Evaluation and Treatment Rehabilitation   THERAPY DIAG:  Chronic pain syndrome   Unsteadiness on feet   Abnormal posture   ONSET DATE: >10 yrs   SUBJECTIVE:                                                                                                                                                                                             SUBJECTIVE STATEMENT: Pt reports he had quite a bit of pain when he returned home after last session, but by the next day he "felt pretty good".  He has been working on the (hooklying) piriformis stretch  at home.    PERTINENT HISTORY:  L shoulder rotator cuff tear with bicep tendon rupture without repair >+5 yrs ago. ROM and strength limited Spinal stimulator DM Htn   PAIN:  Are you having pain? Yes: NPRS scale: 4-5/10 Pain location: lower thoracic spine/lumbar Pain description: stabbing, ache, tight Aggravating factors: bending, twisting, sneezing,walking up to 500 ft Relieving factors: stimulator     PRECAUTIONS: Fall   WEIGHT BEARING RESTRICTIONS No   FALLS:  Has patient fallen in last 6 months? Yes. Number of falls 2   LIVING ENVIRONMENT: Lives with: lives with their family Lives in: House/apartment Stairs: Yes: Internal: 18 steps; can reach both Has following equipment at home: None   OCCUPATION:      PLOF: Independent   PATIENT GOALS Increase flexability     OBJECTIVE:  * Findings taken at [facility] unless otherwise noted.   DIAGNOSTIC FINDINGS:  MRI cervical spine: Cervical Spondylosis 1. 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 2. Mildly worsened moderate right C6-7 neural foraminal stenosis. 3. Unchanged moderate bilateral C3-4 and left C5-6 neural foraminal stenosis. 4. No spinal canal stenosis.   PATIENT SURVEYS:  FOTO 28 with predicted 8 point improvement to 36   SCREENING FOR RED FLAGS: Neg for all   COGNITION:            Overall cognitive status: Within functional limits for tasks assessed                          SENSATION: Peripheral neuropathies: bilat feet decreased sensation to light touch; hot/cold   MUSCLE LENGTH: Hamstrings shortened.  Unable to test due to not tolerating   POSTURE: rounded shoulders, forward head, and decreased lumbar lordosis    PALPATION: Minimally tender lumbar and low thoracic paraspinals upper sacrum   LUMBAR ROM:  Limited 80% in all directions Pain with movement > on LvsR     LOWER EXTREMITY ROM:                WFL   LOWER EXTREMITY MMT:     MMT Right eval Left eval  Hip flexion 4 Seated 4 seated  Hip extension      Hip abduction 5 5  Hip adduction 5 5  Hip internal rotation      Hip external rotation      Knee flexion 5 5  Knee extension 5 5  Ankle dorsiflexion 5 5  Ankle plantarflexion      Ankle inversion      Ankle eversion       (Blank rows = not tested)   LUMBAR SPECIAL TESTS:  Slump test: Negative   FUNCTIONAL TESTS:  5 times sit to stand: 29s Timed up and go (TUG): 20 Berg Balance Scale: 28/56  Berg Balance                                                              07/30/21 Sit to stand 2  Standing unsupported 4  Sitting with back unsupported but feet supported 4  Stand to sit  2  Transfers  3  Standing unsupported with eyes closed 3  Standing unsupported feet together 3  From standing position, reach forward with outstretched arm 2  From standing position, pick up object  from floor 1  From standing position, turn and look behind over each shoulder 2  Turn 360 1  Standing unsupported, alternately place foot on step 1  Standing unsupported, one foot in front 0  Standing on one leg 0  Total:  28          GAIT: Distance walked: 500 Assistive device utilized: None Level of assistance: Complete Independence Comments:    TODAY'S TREATMENT  Pt seen for aquatic therapy today.  Treatment took place in water 3.25-4.8 ft in depth at the [facility] pool. Temp of water was 91.  Pt entered/exited the pool via stairs independently with bilat rail. Into to aquatic environment Warm up of laps both forward and backward walking, side stepping High knee forward marching  Holding yellow noodle:  hip/knee flex crossing midline;  trial of heel raise to/from squat -  increased back pain; switched to wall with good tolerance Holding wall: heel raises/ squats x 15 Tandem gait forward / backward without support Holding wall: Hip abdct/ add x 20; Hip flex/ext leg swings x 12 each LE On bench with blue step under feet:  STS without UE support x 10, cues for nose over toes/ hip hinge Seated on 4th step:   holding rails - flutter kick x 10, hip abdct/add x 10 - 2 sets;  lumbar flexion stretch with arms on kickboard x 3 reps, repeated with lateral flexion x 3 each side, cues to relax onto board. Alternating foot taps to first step with intermittent UE to steady x 20 Repeated piriformis stretch (fig 4) holding rails at steps Forward/ backward walking to/from4.8 ft water for rest and recovery  Pt requires buoyancy for support and to offload joints with strengthening exercises. Viscosity of the water is needed for resistance of strengthening; water current perturbations provides challenge to standing balance unsupported, requiring increased core activation.     PATIENT EDUCATION:  Education details: benefits of aquatic therapy, properties of water. Person educated: Patient Education method: Explanation Education comprehension: verbalized understanding     HOME EXERCISE PROGRAM: To be assigned   ASSESSMENT:   CLINICAL IMPRESSION: Pt reported some increase in back stiffness/pain with high knee marching;  improved with reduction of hip flexion and tightening core muscles.  Some increase in LBP with heel raises/squats holding yellow noodle; improved with increased support/ holding wall.  He reported reduction of LBP to 1/10 by end of session.  He demonstrated decreased balance with Rt stance during foot taps to step; requiring UE to steady due to posterior LOB 2x. He will benefit from skilled physical therapy/aquatics using properties of water to facilitate and hasten progression towards meeting goals.     OBJECTIVE IMPAIRMENTS Abnormal gait, decreased activity  tolerance, decreased balance, decreased endurance, decreased mobility, difficulty walking, decreased ROM, decreased strength, impaired flexibility, impaired sensation, and pain.    ACTIVITY LIMITATIONS lifting, bending, stairs, and reach over head   PARTICIPATION LIMITATIONS: cleaning, community activity, and yard work   PERSONAL FACTORS Age, Past/current experiences, Time since onset of injury/illness/exacerbation, and 3+ comorbidities: Left rotator cuff and bicep tendon tears, Cervical Spondylosis, DM, HTN, Lumbar fusion, chronic pain  are also affecting patient's functional outcome.    REHAB POTENTIAL: Good   CLINICAL DECISION MAKING: Unstable/unpredictable   EVALUATION COMPLEXITY: High     GOALS: Goals reviewed with patient? Yes   SHORT TERM GOALS: Target date: 08/27/2021   Pt will acquire cane; it will be properly adjusted to height with instruction on coordinated use to improve toleration  to amb/less back fatigue Baseline:no cane Goal status: INITIAL   2.  Increased amb distance without  back fatigue 1000 ft to and from aquatics Baseline: 572ft Goal status: INITIAL   3.  Decrease mid thoracic pain< 4/10 Baseline: 4-8/10 Goal status: INITIAL           LONG TERM GOALS: Target date: 10/22/2021   Sharlene Motts balance test to improve to at least 35/56 [facility]) to demonstrate decreased fall risk Baseline: 28/56 Goal status: INITIAL   2.  Foto to predicted 36 or greater Baseline: 28 Goal status: INITIAL   3.  LE hip strength to 5/5 to demonstrate decreased pain in LB and improved strength Baseline: 4/5 limited to pain Goal status: INITIAL   4.  Pt will improve hamstring length to allow for indep and easy and safe donning and doffing of shoes and socks in seated position Baseline: with difficulty in standing position Goal status: INITIAL   5.  Pt will report no falls since onset of skilled physical therapy Baseline: 2 last 5 months Goal status: INITIAL         PLAN: PT  FREQUENCY: 1-2x/week   PT DURATION: 12 weeks   PLANNED INTERVENTIONS: Therapeutic exercises, Therapeutic activity, Neuromuscular re-education, Balance training, Gait training, Patient/Family education, Joint mobilization, Stair training, DME instructions, Aquatic Therapy, Electrical stimulation, and Taping.   PLAN FOR NEXT SESSION: aquatic therapy for stretching LE and core, strengthening core, balance retraining.  Mayer Camel, PTA 08/08/21 11:42 AM

## 2021-08-12 ENCOUNTER — Encounter (HOSPITAL_BASED_OUTPATIENT_CLINIC_OR_DEPARTMENT_OTHER): Payer: Self-pay | Admitting: Physical Therapy

## 2021-08-12 ENCOUNTER — Ambulatory Visit (HOSPITAL_BASED_OUTPATIENT_CLINIC_OR_DEPARTMENT_OTHER): Payer: Medicare Other | Admitting: Physical Therapy

## 2021-08-12 DIAGNOSIS — R293 Abnormal posture: Secondary | ICD-10-CM

## 2021-08-12 DIAGNOSIS — G894 Chronic pain syndrome: Secondary | ICD-10-CM

## 2021-08-12 DIAGNOSIS — R2681 Unsteadiness on feet: Secondary | ICD-10-CM

## 2021-08-12 NOTE — Therapy (Signed)
OUTPATIENT PHYSICAL THERAPY TREATMENT NOTE   Patient Name: Nicholas Morse MRN: [id number] DOB:June 11, 1955, 66 y.o., male Today's Date: 08/12/2021   PT End of Session - 08/12/21 0851     Visit Number 4    Number of Visits 24    Authorization Type medicare    Progress Note Due on Visit 10    PT Start Time 0845    PT Stop Time 0930    PT Time Calculation (min) 45 min             Past Medical History:  Diagnosis Date   Anxiety    Arthritis    DJD (degenerative joint disease)    Hx of seasonal allergies    PMH   Hypothyroidism    Insomnia    Nocturia    Numbness and tingling    B/LLE   Panic attacks    PONV (postoperative nausea and vomiting)    Past Surgical History:  Procedure Laterality Date   BACK SURGERY     CARPAL TUNNEL RELEASE     B/L   COLONOSCOPY     FRACTURE SURGERY     Right knee   KNEE SURGERY     LCL rupture   MAXIMUM ACCESS (MAS)POSTERIOR LUMBAR INTERBODY FUSION (PLIF) 2 LEVEL N/A 11/21/2013   Procedure: FOR MAXIMUM ACCESS (MAS) POSTERIOR LUMBAR INTERBODY FUSION LUMBAR FOUR-FIVE, LUMBAR FIVE-SACRAL ONE WITH REDO DECOMPRESSION;  Surgeon: Maeola Harman, MD;  Location: [facility];  Service: Neurosurgery;  Laterality: N/A;   MUSCLE REPAIR     Bicept and Tricept rupture repair surgery   Patient Active Problem List   Diagnosis Date Noted   Spondylolisthesis of lumbar region 11/21/2013     PCP: Cindie Laroche, MD   REFERRING PROVIDER: Patrica Duel, NP   REFERRING DIAG: G89.4 (ICD-10-CM) - Chronic pain syndrome   Rationale for Evaluation and Treatment Rehabilitation   THERAPY DIAG:  Chronic pain syndrome   Unsteadiness on feet   Abnormal posture   ONSET DATE: >10 yrs   SUBJECTIVE:                                                                                                                                                                                            SUBJECTIVE STATEMENT: Pt reports he was not sore at all after last session.  He  went for a mile walk with walking sticks.  He complains of continue tightness in LB.  "I need to work on my endurance".      PERTINENT HISTORY:  L shoulder rotator cuff tear with bicep tendon rupture without repair >+5 yrs ago. ROM and strength  limited Spinal stimulator DM HTN   PAIN:  Are you having pain? Yes: NPRS scale:2-3 /10 Pain location: lower thoracic spine/lumbar Pain description: stabbing, ache, tight Aggravating factors: bending, twisting, sneezing,walking up to 500 ft Relieving factors: stimulator     PRECAUTIONS: Fall   WEIGHT BEARING RESTRICTIONS No   FALLS:  Has patient fallen in last 6 months? Yes. Number of falls 2   LIVING ENVIRONMENT: Lives with: lives with their family Lives in: House/apartment Stairs: Yes: Internal: 18 steps; can reach both Has following equipment at home: None   OCCUPATION:      PLOF: Independent   PATIENT GOALS Increase flexability     OBJECTIVE:  * Findings taken at [facility] unless otherwise noted.   DIAGNOSTIC FINDINGS:  MRI cervical spine: Cervical Spondylosis 1. 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 2. Mildly worsened moderate right C6-7 neural foraminal stenosis. 3. Unchanged moderate bilateral C3-4 and left C5-6 neural foraminal stenosis. 4. No spinal canal stenosis.   PATIENT SURVEYS:  FOTO 28 with predicted 8 point improvement to 36   SCREENING FOR RED FLAGS: Neg for all   COGNITION:            Overall cognitive status: Within functional limits for tasks assessed                          SENSATION: Peripheral neuropathies: bilat feet decreased sensation to light touch; hot/cold   MUSCLE LENGTH: Hamstrings shortened.  Unable to test due to not tolerating   POSTURE: rounded shoulders, forward head, and decreased lumbar lordosis   PALPATION: Minimally tender lumbar and low thoracic paraspinals upper sacrum   LUMBAR ROM:  Limited 80% in all directions Pain with movement > on LvsR     LOWER  EXTREMITY ROM:                WFL   LOWER EXTREMITY MMT:     MMT Right eval Left eval  Hip flexion 4 Seated 4 seated  Hip extension      Hip abduction 5 5  Hip adduction 5 5  Hip internal rotation      Hip external rotation      Knee flexion 5 5  Knee extension 5 5  Ankle dorsiflexion 5 5  Ankle plantarflexion      Ankle inversion      Ankle eversion       (Blank rows = not tested)   LUMBAR SPECIAL TESTS:  Slump test: Negative   FUNCTIONAL TESTS:  5 times sit to stand: 29s Timed up and go (TUG): 20 Berg Balance Scale: 28/56  Berg Balance                                                              07/30/21 Sit to stand 2  Standing unsupported 4  Sitting with back unsupported but feet supported 4  Stand to sit  2  Transfers  3  Standing unsupported with eyes closed 3  Standing unsupported feet together 3  From standing position, reach forward with outstretched arm 2  From standing position, pick up object from floor 1  From standing position, turn and look behind over each shoulder 2  Turn 360 1  Standing unsupported, alternately place foot  on step 1  Standing unsupported, one foot in front 0  Standing on one leg 0  Total:  28          GAIT: Distance walked: 500 Assistive device utilized: None Level of assistance: Complete Independence Comments:    TODAY'S TREATMENT  Pt seen for aquatic therapy today.  Treatment took place in water 3.25-4.8 ft in depth at the [facility] pool. Temp of water was 91.  Pt entered/exited the pool via stairs independently with bilat rail. Into to aquatic environment Warm up of laps both forward and backward walking, side stepping Side step squat with arm abdct / add  Holding wall: Hip circles CW/CCW x 10 each (unable to tolerate forward hurdle legs without solid support); heel raises x 10 Alternating foot taps to first step with intermittent UE to steady x 10; repeated 5 reps each leg, taping 1st/2nd step High knee  forward marching shallow to deep High knee bwd marching with arm swing (>4 ft)  Tandem gait forward / backward without support Holding wall: Hip abdct/ add x 10; Hip flex/ext leg swings x 10 each LE Holding rails at stairs:  fig 4 piriformis stretch x 15s x 2 reps each LE Seated on 4th step:  STS without UE support x 4 reps (challenging);   lumbar flexion stretch with arms on kickboard x 3 reps, repeated with lateral flexion x 3 each side, cues to relax onto board.  Forward/ backward walking to/from4.8 ft water for rest and recovery  Pt requires buoyancy for support and to offload joints with strengthening exercises. Viscosity of the water is needed for resistance of strengthening; water current perturbations provides challenge to standing balance unsupported, requiring increased core activation.     PATIENT EDUCATION:  Education details: benefits of aquatic therapy, properties of water. Person educated: Patient Education method: Explanation Education comprehension: verbalized understanding     HOME EXERCISE PROGRAM: To be assigned   ASSESSMENT:   CLINICAL IMPRESSION: Pt acclimating well to aquatic environment; able to relax more and hold body less rigid.  He reported some increased LB tightness with forward walking at beginning of session; reduced with change in directions. Overall tolerating exercises well.  He will benefit from skilled physical therapy/aquatics using properties of water to facilitate and hasten progression towards meeting goals.     OBJECTIVE IMPAIRMENTS Abnormal gait, decreased activity tolerance, decreased balance, decreased endurance, decreased mobility, difficulty walking, decreased ROM, decreased strength, impaired flexibility, impaired sensation, and pain.    ACTIVITY LIMITATIONS lifting, bending, stairs, and reach over head   PARTICIPATION LIMITATIONS: cleaning, community activity, and yard work   PERSONAL FACTORS Age, Past/current experiences, Time since  onset of injury/illness/exacerbation, and 3+ comorbidities: Left rotator cuff and bicep tendon tears, Cervical Spondylosis, DM, HTN, Lumbar fusion, chronic pain  are also affecting patient's functional outcome.    REHAB POTENTIAL: Good   CLINICAL DECISION MAKING: Unstable/unpredictable   EVALUATION COMPLEXITY: High     GOALS: Goals reviewed with patient? Yes   SHORT TERM GOALS: Target date: 08/27/2021   Pt will acquire cane; it will be properly adjusted to height with instruction on coordinated use to improve toleration to amb/less back fatigue Baseline:no cane Goal status: INITIAL   2.  Increased amb distance without  back fatigue 1000 ft to and from aquatics Baseline: 567ft Goal status: INITIAL   3.  Decrease mid thoracic pain< 4/10 Baseline: 4-8/10 Goal status: INITIAL           LONG TERM GOALS: Target date:  10/22/2021   Berg balance test to improve to at least 35/56 [facility] to demonstrate decreased fall risk Baseline: 28/56 Goal status: INITIAL   2.  Foto to predicted 36 or greater Baseline: 28 Goal status: INITIAL   3.  LE hip strength to 5/5 to demonstrate decreased pain in LB and improved strength Baseline: 4/5 limited to pain Goal status: INITIAL   4.  Pt will improve hamstring length to allow for indep and easy and safe donning and doffing of shoes and socks in seated position Baseline: with difficulty in standing position Goal status: INITIAL   5.  Pt will report no falls since onset of skilled physical therapy Baseline: 2 last 5 months Goal status: INITIAL         PLAN: PT FREQUENCY: 1-2x/week   PT DURATION: 12 weeks   PLANNED INTERVENTIONS: Therapeutic exercises, Therapeutic activity, Neuromuscular re-education, Balance training, Gait training, Patient/Family education, Joint mobilization, Stair training, DME instructions, Aquatic Therapy, Electrical stimulation, and Taping.   PLAN FOR NEXT SESSION: aquatic therapy for stretching LE and core,  strengthening core, balance retraining.  Mayer CamelJennifer Carlson-Long, PTA 08/12/21 9:33 AM

## 2021-08-18 ENCOUNTER — Ambulatory Visit (HOSPITAL_BASED_OUTPATIENT_CLINIC_OR_DEPARTMENT_OTHER): Payer: Medicare Other | Admitting: Physical Therapy

## 2021-08-18 ENCOUNTER — Encounter (HOSPITAL_BASED_OUTPATIENT_CLINIC_OR_DEPARTMENT_OTHER): Payer: Self-pay | Admitting: Physical Therapy

## 2021-08-18 DIAGNOSIS — R2681 Unsteadiness on feet: Secondary | ICD-10-CM

## 2021-08-18 DIAGNOSIS — G894 Chronic pain syndrome: Secondary | ICD-10-CM | POA: Diagnosis not present

## 2021-08-18 DIAGNOSIS — R293 Abnormal posture: Secondary | ICD-10-CM

## 2021-08-18 NOTE — Therapy (Signed)
OUTPATIENT PHYSICAL THERAPY TREATMENT NOTE   Patient Name: Nicholas Morse MRN: [id number] DOB:10/19/1955, 66 y.o., male Today's Date: 08/18/2021   PT End of Session - 08/18/21 0725     Visit Number 5    Number of Visits 24    Date for PT Re-Evaluation 10/22/21    Authorization Type medicare    Progress Note Due on Visit 10    PT Start Time 0715    PT Stop Time 0755    PT Time Calculation (min) 40 min    Activity Tolerance Patient tolerated treatment well    Behavior During Therapy WFL for tasks assessed/performed             Past Medical History:  Diagnosis Date   Anxiety    Arthritis    DJD (degenerative joint disease)    Hx of seasonal allergies    PMH   Hypothyroidism    Insomnia    Nocturia    Numbness and tingling    B/LLE   Panic attacks    PONV (postoperative nausea and vomiting)    Past Surgical History:  Procedure Laterality Date   BACK SURGERY     CARPAL TUNNEL RELEASE     B/L   COLONOSCOPY     FRACTURE SURGERY     Right knee   KNEE SURGERY     LCL rupture   MAXIMUM ACCESS (MAS)POSTERIOR LUMBAR INTERBODY FUSION (PLIF) 2 LEVEL N/A 11/21/2013   Procedure: FOR MAXIMUM ACCESS (MAS) POSTERIOR LUMBAR INTERBODY FUSION LUMBAR FOUR-FIVE, LUMBAR FIVE-SACRAL ONE WITH REDO DECOMPRESSION;  Surgeon: Maeola Harman, MD;  Location: [facility];  Service: Neurosurgery;  Laterality: N/A;   MUSCLE REPAIR     Bicept and Tricept rupture repair surgery   Patient Active Problem List   Diagnosis Date Noted   Spondylolisthesis of lumbar region 11/21/2013     PCP: Cindie Laroche, MD   REFERRING PROVIDER: Patrica Duel, NP   REFERRING DIAG: G89.4 (ICD-10-CM) - Chronic pain syndrome   Rationale for Evaluation and Treatment Rehabilitation   THERAPY DIAG:  Chronic pain syndrome   Unsteadiness on feet   Abnormal posture   ONSET DATE: >10 yrs   SUBJECTIVE:                                                                                                                                                                                             SUBJECTIVE STATEMENT: Pt reports he is feeling good, hips remain stiff.   He has not bought a cane yet, but uses walking sticks on the track right now.    PERTINENT HISTORY:  L shoulder  rotator cuff tear with bicep tendon rupture without repair >+5 yrs ago. ROM and strength limited Spinal stimulator DM HTN   PAIN:  Are you having pain? Yes: NPRS scale:2/10 Pain location: lower thoracic spine/lumbar Pain description: stabbing, ache, tight Aggravating factors: bending, twisting, sneezing,walking up to 500 ft Relieving factors: stimulator     PRECAUTIONS: Fall   WEIGHT BEARING RESTRICTIONS No   FALLS:  Has patient fallen in last 6 months? Yes. Number of falls 2   LIVING ENVIRONMENT: Lives with: lives with their family Lives in: House/apartment Stairs: Yes: Internal: 18 steps; can reach both Has following equipment at home: None   OCCUPATION:      PLOF: Independent   PATIENT GOALS Increase flexability     OBJECTIVE:  * Findings taken at [facility] unless otherwise noted.   DIAGNOSTIC FINDINGS:  MRI cervical spine: Cervical Spondylosis 1. 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 2. Mildly worsened moderate right C6-7 neural foraminal stenosis. 3. Unchanged moderate bilateral C3-4 and left C5-6 neural foraminal stenosis. 4. No spinal canal stenosis.   PATIENT SURVEYS:  FOTO 28 with predicted 8 point improvement to 36   SCREENING FOR RED FLAGS: Neg for all   COGNITION:            Overall cognitive status: Within functional limits for tasks assessed                          SENSATION: Peripheral neuropathies: bilat feet decreased sensation to light touch; hot/cold   MUSCLE LENGTH: Hamstrings shortened.  Unable to test due to not tolerating   POSTURE: rounded shoulders, forward head, and decreased lumbar lordosis   PALPATION: Minimally tender lumbar and low thoracic  paraspinals upper sacrum   LUMBAR ROM:  Limited 80% in all directions Pain with movement > on LvsR     LOWER EXTREMITY ROM:                WFL   LOWER EXTREMITY MMT:     MMT Right eval Left eval  Hip flexion 4 Seated 4 seated  Hip extension      Hip abduction 5 5  Hip adduction 5 5  Hip internal rotation      Hip external rotation      Knee flexion 5 5  Knee extension 5 5  Ankle dorsiflexion 5 5  Ankle plantarflexion      Ankle inversion      Ankle eversion       (Blank rows = not tested)   LUMBAR SPECIAL TESTS:  Slump test: Negative   FUNCTIONAL TESTS:  5 times sit to stand: 29s Timed up and go (TUG): 20 Berg Balance Scale: 28/56  Berg Balance                                                              07/30/21 Sit to stand 2  Standing unsupported 4  Sitting with back unsupported but feet supported 4  Stand to sit  2  Transfers  3  Standing unsupported with eyes closed 3  Standing unsupported feet together 3  From standing position, reach forward with outstretched arm 2  From standing position, pick up object from floor 1  From standing position, turn and look behind  over each shoulder 2  Turn 360 1  Standing unsupported, alternately place foot on step 1  Standing unsupported, one foot in front 0  Standing on one leg 0  Total:  28          GAIT: Distance walked: 500 Assistive device utilized: None Level of assistance: Complete Independence Comments:    TODAY'S TREATMENT  Pt seen for aquatic therapy today.  Treatment took place in water 3.25-4.8 ft in depth at the [facility] pool. Temp of water was 91.  Pt entered/exited the pool via stairs independently with bilat rail. Into to aquatic environment Warm up of laps both forward and backward walking Side step squat with arm abdct / add  High knee marching forward/ backward Holding wall: Hip circles CW/CCW with knee flexed x 10 each; heel/toes raises x 10 Holding rails at stairs:  low  back flexion stretch; fig 4 piriformis stretch  Alternating foot taps to 1st/2nd step x 10 reps each LE Seated on 4th step:  STS without UE support x 5 reps (improved from last visit); ab set with blue noodle push down (just under surface) x 5s x 5; down to thighs x 10; lumbar flexion stretch to side holding blue noodle x 10s x 2 each side Walking forward/ backward with rainbow/ yellow hand buoys at side for core engagement Holding wall: Hip abdct/ add x 10; Hip flex/ext leg swings x 10 each LE Holding yellow ball: wood chop @ 4 ft x 5 each side; lifting to eye level from surface x 5 Repeated low back flexion stretch with arms on stair rails  Pt requires buoyancy for support and to offload joints with strengthening exercises. Viscosity of the water is needed for resistance of strengthening; water current perturbations provides challenge to standing balance unsupported, requiring increased core activation.     PATIENT EDUCATION:  Education details: benefits of aquatic therapy, properties of water. Person educated: Patient Education method: Explanation Education comprehension: verbalized understanding     HOME EXERCISE PROGRAM: To be assigned   ASSESSMENT:   CLINICAL IMPRESSION: Pt  reported some increased LB tightness with wood chop and lifting ball off surface of water to eye level; reduced with flexion stretch afterwards. Overall tolerating exercises well, with improvements in balance with single leg toe taps.  He will benefit from skilled physical therapy/aquatics using properties of water to facilitate and hasten progression towards meeting goals.     OBJECTIVE IMPAIRMENTS Abnormal gait, decreased activity tolerance, decreased balance, decreased endurance, decreased mobility, difficulty walking, decreased ROM, decreased strength, impaired flexibility, impaired sensation, and pain.    ACTIVITY LIMITATIONS lifting, bending, stairs, and reach over head   PARTICIPATION LIMITATIONS:  cleaning, community activity, and yard work   PERSONAL FACTORS Age, Past/current experiences, Time since onset of injury/illness/exacerbation, and 3+ comorbidities: Left rotator cuff and bicep tendon tears, Cervical Spondylosis, DM, HTN, Lumbar fusion, chronic pain  are also affecting patient's functional outcome.    REHAB POTENTIAL: Good   CLINICAL DECISION MAKING: Unstable/unpredictable   EVALUATION COMPLEXITY: High     GOALS: Goals reviewed with patient? Yes   SHORT TERM GOALS: Target date: 08/27/2021   Pt will acquire cane; it will be properly adjusted to height with instruction on coordinated use to improve toleration to amb/less back fatigue Baseline:no cane Goal status: INITIAL   2.  Increased amb distance without  back fatigue 1000 ft to and from aquatics Baseline: 56000ft Goal status: INITIAL   3.  Decrease mid thoracic pain< 4/10 Baseline: 4-8/10  Goal status: INITIAL           LONG TERM GOALS: Target date: 10/22/2021   Sharlene Motts balance test to improve to at least 35/56 [facility]) to demonstrate decreased fall risk Baseline: 28/56 Goal status: INITIAL   2.  Foto to predicted 36 or greater Baseline: 28 Goal status: INITIAL   3.  LE hip strength to 5/5 to demonstrate decreased pain in LB and improved strength Baseline: 4/5 limited to pain Goal status: INITIAL   4.  Pt will improve hamstring length to allow for indep and easy and safe donning and doffing of shoes and socks in seated position Baseline: with difficulty in standing position Goal status: INITIAL   5.  Pt will report no falls since onset of skilled physical therapy Baseline: 2 last 5 months Goal status: INITIAL         PLAN: PT FREQUENCY: 1-2x/week   PT DURATION: 12 weeks   PLANNED INTERVENTIONS: Therapeutic exercises, Therapeutic activity, Neuromuscular re-education, Balance training, Gait training, Patient/Family education, Joint mobilization, Stair training, DME instructions, Aquatic Therapy,  Electrical stimulation, and Taping.   PLAN FOR NEXT SESSION: aquatic therapy for stretching LE and core, strengthening core, balance retraining.  Mayer Camel, PTA 08/18/21 8:00 AM

## 2021-08-20 ENCOUNTER — Ambulatory Visit (HOSPITAL_BASED_OUTPATIENT_CLINIC_OR_DEPARTMENT_OTHER): Payer: Medicare Other | Admitting: Physical Therapy

## 2021-08-20 ENCOUNTER — Encounter (HOSPITAL_BASED_OUTPATIENT_CLINIC_OR_DEPARTMENT_OTHER): Payer: Self-pay | Admitting: Physical Therapy

## 2021-08-20 DIAGNOSIS — G894 Chronic pain syndrome: Secondary | ICD-10-CM | POA: Diagnosis not present

## 2021-08-20 DIAGNOSIS — R2681 Unsteadiness on feet: Secondary | ICD-10-CM

## 2021-08-20 DIAGNOSIS — R293 Abnormal posture: Secondary | ICD-10-CM

## 2021-08-20 NOTE — Therapy (Signed)
OUTPATIENT PHYSICAL THERAPY TREATMENT NOTE   Patient Name: Nicholas Morse MRN: [id number] DOB:Jun 08, 1955, 66 y.o., male Today's Date: 08/20/2021   PT End of Session - 08/20/21 1638     Visit Number 6    Number of Visits 24    Date for PT Re-Evaluation 10/22/21    Authorization Type medicare    Progress Note Due on Visit 10    PT Start Time 0815    PT Stop Time 0900    PT Time Calculation (min) 45 min    Activity Tolerance Patient tolerated treatment well    Behavior During Therapy WFL for tasks assessed/performed             Past Medical History:  Diagnosis Date   Anxiety    Arthritis    DJD (degenerative joint disease)    Hx of seasonal allergies    PMH   Hypothyroidism    Insomnia    Nocturia    Numbness and tingling    B/LLE   Panic attacks    PONV (postoperative nausea and vomiting)    Past Surgical History:  Procedure Laterality Date   BACK SURGERY     CARPAL TUNNEL RELEASE     B/L   COLONOSCOPY     FRACTURE SURGERY     Right knee   KNEE SURGERY     LCL rupture   MAXIMUM ACCESS (MAS)POSTERIOR LUMBAR INTERBODY FUSION (PLIF) 2 LEVEL N/A 11/21/2013   Procedure: FOR MAXIMUM ACCESS (MAS) POSTERIOR LUMBAR INTERBODY FUSION LUMBAR FOUR-FIVE, LUMBAR FIVE-SACRAL ONE WITH REDO DECOMPRESSION;  Surgeon: Erline Levine, MD;  Location: [facility];  Service: Neurosurgery;  Laterality: N/A;   MUSCLE REPAIR     Bicept and Tricept rupture repair surgery   Patient Active Problem List   Diagnosis Date Noted   Spondylolisthesis of lumbar region 11/21/2013     PCP: Neila Gear, MD   REFERRING PROVIDER: Simona Huh, NP   REFERRING DIAG: G89.4 (ICD-10-CM) - Chronic pain syndrome   Rationale for Evaluation and Treatment Rehabilitation   THERAPY DIAG:  Chronic pain syndrome   Unsteadiness on feet   Abnormal posture   ONSET DATE: >10 yrs   SUBJECTIVE:                                                                                                                                                                                             SUBJECTIVE STATEMENT: Pt reports he is feeling good, hips remain stiff.   He has not bought a cane yet, but uses walking sticks on the track right now.    PERTINENT HISTORY:  L shoulder  rotator cuff tear with bicep tendon rupture without repair >+5 yrs ago. ROM and strength limited Spinal stimulator DM HTN   PAIN:  Are you having pain? Yes: NPRS scale:2/10 Pain location: lower thoracic spine/lumbar Pain description: stabbing, ache, tight Aggravating factors: bending, twisting, sneezing,walking up to 500 ft Relieving factors: stimulator     PRECAUTIONS: Fall   WEIGHT BEARING RESTRICTIONS No   FALLS:  Has patient fallen in last 6 months? Yes. Number of falls 2   LIVING ENVIRONMENT: Lives with: lives with their family Lives in: House/apartment Stairs: Yes: Internal: 18 steps; can reach both Has following equipment at home: None   OCCUPATION:      PLOF: Independent   PATIENT GOALS Increase flexability     OBJECTIVE:  * Findings taken at [facility] unless otherwise noted.   DIAGNOSTIC FINDINGS:  MRI cervical spine: Cervical Spondylosis 1. 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 2. Mildly worsened moderate right C6-7 neural foraminal stenosis. 3. Unchanged moderate bilateral C3-4 and left C5-6 neural foraminal stenosis. 4. No spinal canal stenosis.   PATIENT SURVEYS:  FOTO 28 with predicted 8 point improvement to 36 08/20/21 45   SCREENING FOR RED FLAGS: Neg for all   COGNITION:            Overall cognitive status: Within functional limits for tasks assessed                          SENSATION: Peripheral neuropathies: bilat feet decreased sensation to light touch; hot/cold   MUSCLE LENGTH: Hamstrings shortened.  Unable to test due to not tolerating   POSTURE: rounded shoulders, forward head, and decreased lumbar lordosis   PALPATION: Minimally tender lumbar and low  thoracic paraspinals upper sacrum   LUMBAR ROM:  Limited 80% in all directions Pain with movement > on LvsR     LOWER EXTREMITY ROM:                WFL   LOWER EXTREMITY MMT:     MMT Right eval Left eval  Hip flexion 4 Seated 4 seated  Hip extension      Hip abduction 5 5  Hip adduction 5 5  Hip internal rotation      Hip external rotation      Knee flexion 5 5  Knee extension 5 5  Ankle dorsiflexion 5 5  Ankle plantarflexion      Ankle inversion      Ankle eversion       (Blank rows = not tested)   LUMBAR SPECIAL TESTS:  Slump test: Negative   FUNCTIONAL TESTS:  5 times sit to stand: 29s Timed up and go (TUG): 20 Berg Balance Scale: 28/56  Berg Balance                                                              07/30/21 Sit to stand 2  Standing unsupported 4  Sitting with back unsupported but feet supported 4  Stand to sit  2  Transfers  3  Standing unsupported with eyes closed 3  Standing unsupported feet together 3  From standing position, reach forward with outstretched arm 2  From standing position, pick up object from floor 1  From standing position, turn and  look behind over each shoulder 2  Turn 360 1  Standing unsupported, alternately place foot on step 1  Standing unsupported, one foot in front 0  Standing on one leg 0  Total:  28          GAIT: Distance walked: 500 Assistive device utilized: None Level of assistance: Complete Independence Comments:    TODAY'S TREATMENT  Pt seen for aquatic therapy today.  Treatment took place in water 3.25-4.8 ft in depth at the [facility] pool. Temp of water was 91.  Pt entered/exited the pool via stairs independently with bilat rail. Into to aquatic environment Warm up of laps both forward and backward walking Holding yellow ball: wood chop @ 4 ft x 5 each side; lifting to eye level from surface x 5 Walking forward/ backward with yellow hand buoys at side for core engagement Side step squat  with arm abdct / add yellow hand buoys x 4 widths Holding wall: Hip circles CW/CCW with knee flexed x 10 each; Hip abdct/ add x 10; Hip flex/ext leg swings x 10 each LE Ue supported by yellow hand buoys: heel/toes raises x 10;  Holding rails at stairs:  low back flexion stretch; fig 4 piriformis stretch  Alternating foot taps to 1st/2nd step x 10 reps each LE Seated on 4th step:  STS without UE support x 5 reps (completed without difficulty);  High knee marching forward/ backward throughout session between exercises for recovery Repeated low back flexion stretch with arms on stair rails or pool wall throughout session  Pt requires buoyancy for support and to offload joints with strengthening exercises. Viscosity of the water is needed for resistance of strengthening; water current perturbations provides challenge to standing balance unsupported, requiring increased core activation.     PATIENT EDUCATION:  Education details: benefits of aquatic therapy, properties of water. Person educated: Patient Education method: Explanation Education comprehension: verbalized understanding     HOME EXERCISE PROGRAM:    ASSESSMENT:   CLINICAL IMPRESSION: Pt reports he is now walking 1 mile 2 x a week using walking sticks with one seated rest period x ~ 5 minutes.  Foto goal met.  He does reports some core discomfort after last session probably secondary to added rotation exercises which relieved after a few hours.  Pain has systematically decreased since beginning of therapy. He completes exercises well requiring many rest periods to stretch LB into forward flex. Goals ongoing      OBJECTIVE IMPAIRMENTS Abnormal gait, decreased activity tolerance, decreased balance, decreased endurance, decreased mobility, difficulty walking, decreased ROM, decreased strength, impaired flexibility, impaired sensation, and pain.    ACTIVITY LIMITATIONS lifting, bending, stairs, and reach over head   PARTICIPATION  LIMITATIONS: cleaning, community activity, and yard work   PERSONAL FACTORS Age, Past/current experiences, Time since onset of injury/illness/exacerbation, and 3+ comorbidities: Left rotator cuff and bicep tendon tears, Cervical Spondylosis, DM, HTN, Lumbar fusion, chronic pain  are also affecting patient's functional outcome.    REHAB POTENTIAL: Good   CLINICAL DECISION MAKING: Unstable/unpredictable   EVALUATION COMPLEXITY: High     GOALS: Goals reviewed with patient? Yes   SHORT TERM GOALS: Target date: 08/27/2021   Pt will acquire cane; it will be properly adjusted to height with instruction on coordinated use to improve toleration to amb/less back fatigue Baseline:no cane Goal status: INITIAL   2.  Increased amb distance without  back fatigue 1000 ft to and from aquatics Baseline: 550f Goal status: INITIAL   3.  Decrease mid  thoracic pain< 4/10 Baseline: 4-8/10 Goal status: INITIAL           LONG TERM GOALS: Target date: 10/22/2021   Merrilee Jansky balance test to improve to at least 35/56 [facility]) to demonstrate decreased fall risk Baseline: 28/56 Goal status: INITIAL   2.  Foto to predicted 36 or greater Baseline: 28 Goal status:Achieved 08/20/21   3.  LE hip strength to 5/5 to demonstrate decreased pain in LB and improved strength Baseline: 4/5 limited to pain Goal status: INITIAL   4.  Pt will improve hamstring length to allow for indep and easy and safe donning and doffing of shoes and socks in seated position Baseline: with difficulty in standing position Goal status: INITIAL   5.  Pt will report no falls since onset of skilled physical therapy Baseline: 2 last 5 months Goal status: INITIAL         PLAN: PT FREQUENCY: 1-2x/week   PT DURATION: 12 weeks   PLANNED INTERVENTIONS: Therapeutic exercises, Therapeutic activity, Neuromuscular re-education, Balance training, Gait training, Patient/Family education, Joint mobilization, Stair training, DME instructions,  Aquatic Therapy, Electrical stimulation, and Taping.   PLAN FOR NEXT SESSION: aquatic therapy for stretching LE and core, strengthening core, balance retraining. Will consider adding land visits in future  Stanton Kidney [facility]) Aylee Littrell MPT 08/20/21 1:14 PM

## 2021-08-25 ENCOUNTER — Encounter (HOSPITAL_BASED_OUTPATIENT_CLINIC_OR_DEPARTMENT_OTHER): Payer: Self-pay | Admitting: Physical Therapy

## 2021-08-25 ENCOUNTER — Ambulatory Visit (HOSPITAL_BASED_OUTPATIENT_CLINIC_OR_DEPARTMENT_OTHER): Payer: Medicare Other | Admitting: Physical Therapy

## 2021-08-25 DIAGNOSIS — R293 Abnormal posture: Secondary | ICD-10-CM

## 2021-08-25 DIAGNOSIS — G894 Chronic pain syndrome: Secondary | ICD-10-CM

## 2021-08-25 DIAGNOSIS — R2681 Unsteadiness on feet: Secondary | ICD-10-CM

## 2021-08-25 NOTE — Therapy (Signed)
OUTPATIENT PHYSICAL THERAPY TREATMENT NOTE   Patient Name: Nicholas Morse MRN: [id number] DOB:[birth year], 66 y.o., male Today's Date: 08/25/2021   PT End of Session - 08/25/21 0714     Visit Number 7    Number of Visits 24    Date for PT Re-Evaluation 10/22/21    Authorization Type medicare    Authorization - Visit Number 7    Progress Note Due on Visit 10    PT Start Time 0711    PT Stop Time 0755    PT Time Calculation (min) 44 min    Activity Tolerance Patient tolerated treatment well    Behavior During Therapy WFL for tasks assessed/performed             Past Medical History:  Diagnosis Date   Anxiety    Arthritis    DJD (degenerative joint disease)    Hx of seasonal allergies    PMH   Hypothyroidism    Insomnia    Nocturia    Numbness and tingling    B/LLE   Panic attacks    PONV (postoperative nausea and vomiting)    Past Surgical History:  Procedure Laterality Date   BACK SURGERY     CARPAL TUNNEL RELEASE     B/L   COLONOSCOPY     FRACTURE SURGERY     Right knee   KNEE SURGERY     LCL rupture   MAXIMUM ACCESS (MAS)POSTERIOR LUMBAR INTERBODY FUSION (PLIF) 2 LEVEL N/A 11/21/2013   Procedure: FOR MAXIMUM ACCESS (MAS) POSTERIOR LUMBAR INTERBODY FUSION LUMBAR FOUR-FIVE, LUMBAR FIVE-SACRAL ONE WITH REDO DECOMPRESSION;  Surgeon: Erline Levine, MD;  Location: [facility];  Service: Neurosurgery;  Laterality: N/A;   MUSCLE REPAIR     Bicept and Tricept rupture repair surgery   Patient Active Problem List   Diagnosis Date Noted   Spondylolisthesis of lumbar region 11/21/2013     PCP: Neila Gear, MD   REFERRING PROVIDER: Simona Huh, NP   REFERRING DIAG: G89.4 (ICD-10-CM) - Chronic pain syndrome   Rationale for Evaluation and Treatment Rehabilitation   THERAPY DIAG:  Chronic pain syndrome   Unsteadiness on feet   Abnormal posture   ONSET DATE: >10 yrs   SUBJECTIVE:                                                                                                                                                                                             SUBJECTIVE STATEMENT: Pt reports he was sore after last session, but it resolved by next day.  He went to [place] for two days this weekend; a lot of  walking and he brought a cane with which helped.     PERTINENT HISTORY:  L shoulder rotator cuff tear with bicep tendon rupture without repair >+5 yrs ago. ROM and strength limited Spinal stimulator DM HTN   PAIN:  Are you having pain? Yes NPRS scale:2/10 Pain location: lower thoracic spine/lumbar Pain description: stabbing, ache, tight Aggravating factors: bending, twisting, sneezing Relieving factors: stimulator     PRECAUTIONS: Fall   WEIGHT BEARING RESTRICTIONS No   FALLS:  Has patient fallen in last 6 months? Yes. Number of falls 2   LIVING ENVIRONMENT: Lives with: lives with their family Lives in: House/apartment Stairs: Yes: Internal: 18 steps; can reach both Has following equipment at home: None   OCCUPATION:      PLOF: Independent   PATIENT GOALS Increase flexability     OBJECTIVE:  * Findings [facility] unless otherwise noted.   DIAGNOSTIC FINDINGS:  MRI cervical spine: Cervical Spondylosis 1. 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 2. Mildly worsened moderate right C6-7 neural foraminal stenosis. 3. Unchanged moderate bilateral C3-4 and left C5-6 neural foraminal stenosis. 4. No spinal canal stenosis.   PATIENT SURVEYS:  FOTO 28 with predicted 8 point improvement to 36 08/20/21 45   SCREENING FOR RED FLAGS: Neg for all   COGNITION:            Overall cognitive status: Within functional limits for tasks assessed                          SENSATION: Peripheral neuropathies: bilat feet decreased sensation to light touch; hot/cold   MUSCLE LENGTH: Hamstrings shortened.  Unable to test due to not tolerating   POSTURE: rounded shoulders, forward head, and decreased lumbar  lordosis   PALPATION: Minimally tender lumbar and low thoracic paraspinals upper sacrum   LUMBAR ROM:  Limited 80% in all directions Pain with movement > on LvsR     LOWER EXTREMITY ROM:                WFL   LOWER EXTREMITY MMT:     MMT Right eval Left eval  Hip flexion 4 Seated 4 seated  Hip extension      Hip abduction 5 5  Hip adduction 5 5  Hip internal rotation      Hip external rotation      Knee flexion 5 5  Knee extension 5 5  Ankle dorsiflexion 5 5  Ankle plantarflexion      Ankle inversion      Ankle eversion       (Blank rows = not tested)   LUMBAR SPECIAL TESTS:  Slump test: Negative   FUNCTIONAL TESTS:  5 times sit to stand: 29s Timed up and go (TUG): 20 Berg Balance Scale: 28/56  Berg Balance                                                              07/30/21 Sit to stand 2  Standing unsupported 4  Sitting with back unsupported but feet supported 4  Stand to sit  2  Transfers  3  Standing unsupported with eyes closed 3  Standing unsupported feet together 3  From standing position, reach forward with outstretched arm 2  From  standing position, pick up object from floor 1  From standing position, turn and look behind over each shoulder 2  Turn 360 1  Standing unsupported, alternately place foot on step 1  Standing unsupported, one foot in front 0  Standing on one leg 0  Total:  28          GAIT: Distance walked: 500 Assistive device utilized: None Level of assistance: Complete Independence Comments:    TODAY'S TREATMENT  Pt seen for aquatic therapy today.  Treatment took place in water 3.25-4.8 ft in depth at the [facility] pool. Temp of water was 91.  Pt entered/exited the pool via stairs independently with bilat rail. Into to aquatic environment Warm up of laps both forward and backward walking Side stepping with arms abdct/ add - with varied speeds Braiding LE SLS x 15 sec x 2 reps each leg no UE support (over 9f  depth) Holding yellow ball: wood chop @ 4 ft x 5 each side;  Kick board push/ pull at 10, 12, 2 o'clock in staggered stance, x 5 each leg forward lifting yellow ball to just over eye level from surface x 5 in staggered stance Walking forward/ backward with yellow hand buoys at side for core engagement Wall push up/off x 10 Alternating foot taps to 2nd step x 10 reps  Wood chop with single foot on 2nd step x 5 each side  Seated on 4th step:  STS without UE support x 5 reps x 2 sets Seated low back (flexion) stretch holding kick board Forward/ backward walking in 4.8 ft for rest and recovery Holding wall: Hip abdct/ add x 10; Hip flex/ext leg swings x 10 each LE Holding rails at stairs:  low back flexion stretch; fig 4 piriformis stretch   Repeated low back flexion stretch with arms on stair rails or pool wall throughout session  Pt requires buoyancy for support and to offload joints with strengthening exercises. Viscosity of the water is needed for resistance of strengthening; water current perturbations provides challenge to standing balance unsupported, requiring increased core activation.     PATIENT EDUCATION:  Education details: benefits of aquatic therapy, properties of water. Person educated: Patient Education method: Explanation Education comprehension: verbalized understanding     HOME EXERCISE PROGRAM:    ASSESSMENT:   CLINICAL IMPRESSION: Pt  tolerated rotation exercises better than previous sessions (wood chop and kick board push pull on angles).  He is reporting improved stamina in pool and outside of sessions. Improved ability to complete fig 4 stretch in water.  He completes exercises well requiring minor rest periods to stretch LB into forward flex. Pt has met his STGs.       OBJECTIVE IMPAIRMENTS Abnormal gait, decreased activity tolerance, decreased balance, decreased endurance, decreased mobility, difficulty walking, decreased ROM, decreased strength, impaired  flexibility, impaired sensation, and pain.    ACTIVITY LIMITATIONS lifting, bending, stairs, and reach over head   PARTICIPATION LIMITATIONS: cleaning, community activity, and yard work   PERSONAL FACTORS Age, Past/current experiences, Time since onset of injury/illness/exacerbation, and 3+ comorbidities: Left rotator cuff and bicep tendon tears, Cervical Spondylosis, DM, HTN, Lumbar fusion, chronic pain  are also affecting patient's functional outcome.    REHAB POTENTIAL: Good   CLINICAL DECISION MAKING: Unstable/unpredictable   EVALUATION COMPLEXITY: High     GOALS: Goals reviewed with patient? Yes   SHORT TERM GOALS: Target date: 08/27/2021   Pt will acquire cane; it will be properly adjusted to height with instruction on coordinated  use to improve toleration to amb/less back fatigue Baseline: Goal status: Achieved 08/25/21   2.  Increased amb distance without  back fatigue 1000 ft to and from aquatics Baseline: 537f Goal status: Achieved 08/25/21   3.  Decrease mid thoracic pain< 4/10 Baseline: 3-4/10 Goal status: Achieved 08/25/21           LONG TERM GOALS: Target date: 10/22/2021   BMerrilee Janskybalance test to improve to at least 35/56 ([facility] to demonstrate decreased fall risk Baseline: 28/56 Goal status: INITIAL   2.  Foto to predicted 36 or greater Baseline: 28 Goal status:Achieved 08/20/21   3.  LE hip strength to 5/5 to demonstrate decreased pain in LB and improved strength Baseline: 4/5 limited to pain Goal status: INITIAL   4.  Pt will improve hamstring length to allow for indep and easy and safe donning and doffing of shoes and socks in seated position Baseline: with difficulty in standing position Goal status: INITIAL   5.  Pt will report no falls since onset of skilled physical therapy Baseline: 2 last 5 months Goal status: INITIAL         PLAN: PT FREQUENCY: 1-2x/week   PT DURATION: 12 weeks   PLANNED INTERVENTIONS: Therapeutic exercises,  Therapeutic activity, Neuromuscular re-education, Balance training, Gait training, Patient/Family education, Joint mobilization, Stair training, DME instructions, Aquatic Therapy, Electrical stimulation, and Taping.   PLAN FOR NEXT SESSION: aquatic therapy for stretching LE and core, strengthening core, balance retraining. Will consider adding land visits in future  [facility] PTA 08/25/21 7:52 AM

## 2021-08-27 ENCOUNTER — Ambulatory Visit (HOSPITAL_BASED_OUTPATIENT_CLINIC_OR_DEPARTMENT_OTHER): Payer: Medicare Other | Admitting: Physical Therapy

## 2021-08-27 ENCOUNTER — Encounter (HOSPITAL_BASED_OUTPATIENT_CLINIC_OR_DEPARTMENT_OTHER): Payer: Self-pay | Admitting: Physical Therapy

## 2021-08-27 DIAGNOSIS — G894 Chronic pain syndrome: Secondary | ICD-10-CM | POA: Diagnosis not present

## 2021-08-27 DIAGNOSIS — R2681 Unsteadiness on feet: Secondary | ICD-10-CM

## 2021-08-27 DIAGNOSIS — R293 Abnormal posture: Secondary | ICD-10-CM

## 2021-08-27 NOTE — Therapy (Signed)
OUTPATIENT PHYSICAL THERAPY TREATMENT NOTE   Patient Name: Nicholas Morse MRN: [id number] DOB:[birth year], 66 y.o., male Today's Date: 08/27/2021   PT End of Session - 08/27/21 0725     Visit Number 8    Number of Visits 24    Date for PT Re-Evaluation 10/22/21    Authorization Type medicare    Authorization - Visit Number 8    Progress Note Due on Visit 10    PT Start Time 0715    PT Stop Time 0755    PT Time Calculation (min) 40 min    Activity Tolerance Patient tolerated treatment well    Behavior During Therapy WFL for tasks assessed/performed             Past Medical History:  Diagnosis Date   Anxiety    Arthritis    DJD (degenerative joint disease)    Hx of seasonal allergies    PMH   Hypothyroidism    Insomnia    Nocturia    Numbness and tingling    B/LLE   Panic attacks    PONV (postoperative nausea and vomiting)    Past Surgical History:  Procedure Laterality Date   BACK SURGERY     CARPAL TUNNEL RELEASE     B/L   COLONOSCOPY     FRACTURE SURGERY     Right knee   KNEE SURGERY     LCL rupture   MAXIMUM ACCESS (MAS)POSTERIOR LUMBAR INTERBODY FUSION (PLIF) 2 LEVEL N/A 11/21/2013   Procedure: FOR MAXIMUM ACCESS (MAS) POSTERIOR LUMBAR INTERBODY FUSION LUMBAR FOUR-FIVE, LUMBAR FIVE-SACRAL ONE WITH REDO DECOMPRESSION;  Surgeon: Maeola Harman, MD;  Location: [facility];  Service: Neurosurgery;  Laterality: N/A;   MUSCLE REPAIR     Bicept and Tricept rupture repair surgery   Patient Active Problem List   Diagnosis Date Noted   Spondylolisthesis of lumbar region 11/21/2013     PCP: Cindie Laroche, MD   REFERRING PROVIDER: Patrica Duel, NP   REFERRING DIAG: G89.4 (ICD-10-CM) - Chronic pain syndrome   Rationale for Evaluation and Treatment Rehabilitation   THERAPY DIAG:  Chronic pain syndrome   Unsteadiness on feet   Abnormal posture   ONSET DATE: >10 yrs   SUBJECTIVE:                                                                                                                                                                                             SUBJECTIVE STATEMENT: Pt reports no new changes since last visit.    PERTINENT HISTORY:  L shoulder rotator cuff tear with bicep tendon rupture without repair >+5 yrs  ago. ROM and strength limited Spinal stimulator DM HTN   PAIN:  Are you having pain? Yes NPRS scale:2/10 Pain location: lower thoracic spine/lumbar Pain description: stabbing, ache, tight Aggravating factors: bending, twisting, sneezing Relieving factors: stimulator     PRECAUTIONS: Fall   WEIGHT BEARING RESTRICTIONS No   FALLS:  Has patient fallen in last 6 months? Yes. Number of falls 2   LIVING ENVIRONMENT: Lives with: lives with their family Lives in: House/apartment Stairs: Yes: Internal: 18 steps; can reach both Has following equipment at home: None   OCCUPATION:      PLOF: Independent   PATIENT GOALS Increase flexability     OBJECTIVE:  * Findings taken at [facility] unless otherwise noted.   DIAGNOSTIC FINDINGS:  MRI cervical spine: Cervical Spondylosis 1. 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 2. Mildly worsened moderate right C6-7 neural foraminal stenosis. 3. Unchanged moderate bilateral C3-4 and left C5-6 neural foraminal stenosis. 4. No spinal canal stenosis.   PATIENT SURVEYS:  FOTO 28 with predicted 8 point improvement to 36 08/20/21 45   SCREENING FOR RED FLAGS: Neg for all   COGNITION:            Overall cognitive status: Within functional limits for tasks assessed                          SENSATION: Peripheral neuropathies: bilat feet decreased sensation to light touch; hot/cold   MUSCLE LENGTH: Hamstrings shortened.  Unable to test due to not tolerating   POSTURE: rounded shoulders, forward head, and decreased lumbar lordosis   PALPATION: Minimally tender lumbar and low thoracic paraspinals upper sacrum   LUMBAR ROM:  Limited 80% in all  directions Pain with movement > on LvsR     LOWER EXTREMITY ROM:                WFL   LOWER EXTREMITY MMT:     MMT Right eval Left eval  Hip flexion 4 Seated 4 seated  Hip extension      Hip abduction 5 5  Hip adduction 5 5  Hip internal rotation      Hip external rotation      Knee flexion 5 5  Knee extension 5 5  Ankle dorsiflexion 5 5  Ankle plantarflexion      Ankle inversion      Ankle eversion       (Blank rows = not tested)   LUMBAR SPECIAL TESTS:  Slump test: Negative   FUNCTIONAL TESTS:  5 times sit to stand: 29s Timed up and go (TUG): 20 Berg Balance Scale: 28/56  Berg Balance                                                              07/30/21 Sit to stand 2  Standing unsupported 4  Sitting with back unsupported but feet supported 4  Stand to sit  2  Transfers  3  Standing unsupported with eyes closed 3  Standing unsupported feet together 3  From standing position, reach forward with outstretched arm 2  From standing position, pick up object from floor 1  From standing position, turn and look behind over each shoulder 2  Turn 360 1  Standing unsupported, alternately place  foot on step 1  Standing unsupported, one foot in front 0  Standing on one leg 0  Total:  28          GAIT: Distance walked: 500 Assistive device utilized: None Level of assistance: Complete Independence Comments:    TODAY'S TREATMENT  Pt seen for aquatic therapy today.  Treatment took place in water 3.25-4.8 ft in depth at the [facility] pool. Temp of water was 91.  Pt entered/exited the pool via stairs independently with bilat rail.  Warm up of laps both forward and backward walking Wall push up/off x 10 Side stepping with arms abdct/ add holding rainbow hand buoys Forward walking lunges holding rainbow hand buoys at sides Holding yellow ball: wood chop @ 4 ft x 5 each side SLS x 20 sec x 2 reps each leg no UE support (over 64ft depth) Tandem stance  in 3.5  ft Tandem gait forward/ backward lifting yellow ball to just over eye level from surface x 5 in staggered stance, 2 sets TUG like exercise x 2 reps; repeated with side stepping L/R Seated on 4th step with forward flexion stretch, arms on kickboard Seated on 4th step:  STS without UE support x 5 reps  Holding rails at stairs:  hamstring stretch with foot on 2nd step; low back flexion stretch; fig 4 piriformis stretch   Repeated low back flexion stretch with arms on stair rails or pool wall throughout session  Pt requires buoyancy for support and to offload joints with strengthening exercises. Viscosity of the water is needed for resistance of strengthening; water current perturbations provides challenge to standing balance unsupported, requiring increased core activation.     PATIENT EDUCATION:  Education details: benefits of aquatic therapy, properties of water. Person educated: Patient Education method: Explanation Education comprehension: verbalized understanding     HOME EXERCISE PROGRAM:    ASSESSMENT:   CLINICAL IMPRESSION: Continued difficulty with Lt SLS; requires depth over 4 ft to complete 20 sec without support.   Tandem gait at decreased water depth tightened back; reduced with low back stretches. No increase in pain during session. Continued overall improvement in endurance and exercise tolerance. Began discussing plan for after discharge (community resources/ HEP, etc).  Progressing towards LTGs.      OBJECTIVE IMPAIRMENTS Abnormal gait, decreased activity tolerance, decreased balance, decreased endurance, decreased mobility, difficulty walking, decreased ROM, decreased strength, impaired flexibility, impaired sensation, and pain.    ACTIVITY LIMITATIONS lifting, bending, stairs, and reach over head   PARTICIPATION LIMITATIONS: cleaning, community activity, and yard work   PERSONAL FACTORS Age, Past/current experiences, Time since onset of injury/illness/exacerbation,  and 3+ comorbidities: Left rotator cuff and bicep tendon tears, Cervical Spondylosis, DM, HTN, Lumbar fusion, chronic pain  are also affecting patient's functional outcome.    REHAB POTENTIAL: Good   CLINICAL DECISION MAKING: Unstable/unpredictable   EVALUATION COMPLEXITY: High     GOALS: Goals reviewed with patient? Yes   SHORT TERM GOALS: Target date: 08/27/2021   Pt will acquire cane; it will be properly adjusted to height with instruction on coordinated use to improve toleration to amb/less back fatigue Baseline: Goal status: Achieved 08/25/21   2.  Increased amb distance without  back fatigue 1000 ft to and from aquatics Baseline: 561ft Goal status: Achieved 08/25/21   3.  Decrease mid thoracic pain< 4/10 Baseline: 3-4/10 Goal status: Achieved 08/25/21           LONG TERM GOALS: Target date: 10/22/2021   Sharlene Motts balance test to  improve to at least 35/56 ([facility]) to demonstrate decreased fall risk Baseline: 28/56 Goal status: INITIAL   2.  Foto to predicted 36 or greater Baseline: 28 Goal status:Achieved 08/20/21   3.  LE hip strength to 5/5 to demonstrate decreased pain in LB and improved strength Baseline: 4/5 limited to pain Goal status: INITIAL   4.  Pt will improve hamstring length to allow for indep and easy and safe donning and doffing of shoes and socks in seated position Baseline: with difficulty in standing position Goal status: INITIAL   5.  Pt will report no falls since onset of skilled physical therapy Baseline: 2 last 5 months Goal status: INITIAL         PLAN: PT FREQUENCY: 1-2x/week   PT DURATION: 12 weeks   PLANNED INTERVENTIONS: Therapeutic exercises, Therapeutic activity, Neuromuscular re-education, Balance training, Gait training, Patient/Family education, Joint mobilization, Stair training, DME instructions, Aquatic Therapy, Electrical stimulation, and Taping.   PLAN FOR NEXT SESSION: aquatic therapy for stretching LE and core, strengthening  core, balance retraining.   Mayer Camel, PTA 08/27/21 8:03 AM

## 2021-09-01 ENCOUNTER — Ambulatory Visit (HOSPITAL_BASED_OUTPATIENT_CLINIC_OR_DEPARTMENT_OTHER): Payer: Medicare Other | Admitting: Physical Therapy

## 2021-09-01 ENCOUNTER — Encounter (HOSPITAL_BASED_OUTPATIENT_CLINIC_OR_DEPARTMENT_OTHER): Payer: Self-pay | Admitting: Physical Therapy

## 2021-09-01 DIAGNOSIS — R293 Abnormal posture: Secondary | ICD-10-CM

## 2021-09-01 DIAGNOSIS — G894 Chronic pain syndrome: Secondary | ICD-10-CM | POA: Diagnosis not present

## 2021-09-01 DIAGNOSIS — R2681 Unsteadiness on feet: Secondary | ICD-10-CM

## 2021-09-03 ENCOUNTER — Ambulatory Visit (HOSPITAL_BASED_OUTPATIENT_CLINIC_OR_DEPARTMENT_OTHER): Payer: Medicare Other | Admitting: Physical Therapy

## 2021-09-03 DIAGNOSIS — G894 Chronic pain syndrome: Secondary | ICD-10-CM

## 2021-09-03 DIAGNOSIS — R2681 Unsteadiness on feet: Secondary | ICD-10-CM

## 2021-09-03 DIAGNOSIS — R293 Abnormal posture: Secondary | ICD-10-CM

## 2021-09-03 NOTE — Therapy (Addendum)
OUTPATIENT PHYSICAL THERAPY PROGRESS NOTE Progress Note Reporting Period 07/30/21 to 09/03/21  See note below for Objective Data and Assessment of Progress/Goals.       Patient Name: Nicholas Morse MRN: [id number] DOB:August 22, 1955, 66 y.o., male Today's Date: 09/03/2021   PT End of Session - 09/03/21 0742     Visit Number 10    Number of Visits 24    Date for PT Re-Evaluation 10/22/21    Authorization Type medicare    Authorization - Visit Number 10    Progress Note Due on Visit 10    PT Start Time 0715    PT Stop Time 0755    PT Time Calculation (min) 40 min    Activity Tolerance Patient tolerated treatment well    Behavior During Therapy WFL for tasks assessed/performed              Past Medical History:  Diagnosis Date   Anxiety    Arthritis    DJD (degenerative joint disease)    Hx of seasonal allergies    PMH   Hypothyroidism    Insomnia    Nocturia    Numbness and tingling    B/LLE   Panic attacks    PONV (postoperative nausea and vomiting)    Past Surgical History:  Procedure Laterality Date   BACK SURGERY     CARPAL TUNNEL RELEASE     B/L   COLONOSCOPY     FRACTURE SURGERY     Right knee   KNEE SURGERY     LCL rupture   MAXIMUM ACCESS (MAS)POSTERIOR LUMBAR INTERBODY FUSION (PLIF) 2 LEVEL N/A 11/21/2013   Procedure: FOR MAXIMUM ACCESS (MAS) POSTERIOR LUMBAR INTERBODY FUSION LUMBAR FOUR-FIVE, LUMBAR FIVE-SACRAL ONE WITH REDO DECOMPRESSION;  Surgeon: Erline Levine, MD;  Location: [place] [facility];  Service: Neurosurgery;  Laterality: N/A;   MUSCLE REPAIR     Bicept and Tricept rupture repair surgery   Patient Active Problem List   Diagnosis Date Noted   Spondylolisthesis of lumbar region 11/21/2013     PCP: Neila Gear, MD   REFERRING PROVIDER: Simona Huh, NP   REFERRING DIAG: G89.4 (ICD-10-CM) - Chronic pain syndrome   Rationale for Evaluation and Treatment Rehabilitation   THERAPY DIAG:  Chronic pain syndrome   Unsteadiness on feet    Abnormal posture   ONSET DATE: >10 yrs   SUBJECTIVE:                                                                                                                                                                                            SUBJECTIVE STATEMENT: Pt reports he has  had more stiffness due to sitting at hospital with his mother over the last week.     PERTINENT HISTORY:  L shoulder rotator cuff tear with bicep tendon rupture without repair >+5 yrs ago. ROM and strength limited Spinal stimulator DM HTN   PAIN:  Are you having pain? Yes NPRS scale:3-4/10 Pain location: lower thoracic spine/lumbar Pain description: stabbing, ache, tight Aggravating factors: bending, twisting, sneezing Relieving factors: stimulator, stretches     PRECAUTIONS: Fall   WEIGHT BEARING RESTRICTIONS No   FALLS:  Has patient fallen in last 6 months? Yes. Number of falls 2   LIVING ENVIRONMENT: Lives with: lives with their family Lives in: House/apartment Stairs: Yes: Internal: 18 steps; can reach both Has following equipment at home: None   OCCUPATION:      PLOF: Independent   PATIENT GOALS Increase flexability     OBJECTIVE:  * Findings taken at [facility] unless otherwise noted.   DIAGNOSTIC FINDINGS:  MRI cervical spine: Cervical Spondylosis 1. 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 2. Mildly worsened moderate right C6-7 neural foraminal stenosis. 3. Unchanged moderate bilateral C3-4 and left C5-6 neural foraminal stenosis. 4. No spinal canal stenosis.   PATIENT SURVEYS:  FOTO 28 with predicted 8 point improvement to 36 08/20/21 45   SCREENING FOR RED FLAGS: Neg for all   COGNITION:            Overall cognitive status: Within functional limits for tasks assessed                          SENSATION: Peripheral neuropathies: bilat feet decreased sensation to light touch; hot/cold   MUSCLE LENGTH: Hamstrings shortened.  Unable to test due to not  tolerating   POSTURE: rounded shoulders, forward head, and decreased lumbar lordosis   PALPATION: Minimally tender lumbar and low thoracic paraspinals upper sacrum   LUMBAR ROM:  Limited 80% in all directions Pain with movement > on LvsR     LOWER EXTREMITY ROM:                WFL   LOWER EXTREMITY MMT:     MMT Right eval Left eval  Hip flexion 4 Seated 4 seated  Hip extension      Hip abduction 5 5  Hip adduction 5 5  Hip internal rotation      Hip external rotation      Knee flexion 5 5  Knee extension 5 5  Ankle dorsiflexion 5 5  Ankle plantarflexion      Ankle inversion      Ankle eversion       (Blank rows = not tested)   LUMBAR SPECIAL TESTS:  Slump test: Negative   FUNCTIONAL TESTS:  5 times sit to stand: 29s Timed up and go (TUG): 20 Berg Balance Scale: 28/56  Berg Balance                                                              07/30/21   09/03/21 Sit to stand 2 4  Standing unsupported 4 4  Sitting with back unsupported but feet supported 4 4  Stand to sit  2 4  Transfers  3 4  Standing unsupported with eyes closed 3 4  Standing  unsupported feet together 3 4  From standing position, reach forward with outstretched arm 2 3  From standing position, pick up object from floor 1 3  From standing position, turn and look behind over each shoulder 2 2  Turn 360 1 2  Standing unsupported, alternately place foot on step 1 4  Standing unsupported, one foot in front 0 3  Standing on one leg 0 1  Total:  28 46          GAIT: Distance walked: 500 Assistive device utilized: None Level of assistance: Complete Independence Comments:    TODAY'S TREATMENT  Pt seen for aquatic therapy today.  Treatment took place in water 3.25-4.8 ft in depth at the [facility] pool. Temp of water was 91.  Pt entered/exited the pool via stairs independently with bilat rail.  Warm up of laps both forward and backward walking, side stepping - various  speeds Holding hand buoys under water with forward/backward gait: rainbow, yellow and blue -1-2 laps with each Ai Chi: floating, uplifting, enclosing, folding, soothing( 5 each), gathering (5 each), freeing (2 each side) Walking forward for rest and recovery followed by Low back stretch holding wall Seated on 4th step: STS x 10 reps;  kickboard push downs for ab set x 3sec x 10 Seated on 4th step with forward flexion stretch, arms on kickboard Holding rails at stairs:  hamstring stretch with foot on 2nd step; low back flexion stretch; fig 4 piriformis stretch  BERG balance test   Pt requires buoyancy for support and to offload joints with strengthening exercises. Viscosity of the water is needed for resistance of strengthening; water current perturbations provides challenge to standing balance unsupported, requiring increased core activation.     PATIENT EDUCATION:  Education details: benefits of aquatic therapy, properties of water. Person educated: Patient Education method: Explanation Education comprehension: verbalized understanding     HOME EXERCISE PROGRAM: To be established   ASSESSMENT:   CLINICAL IMPRESSION: Pt demonstrated improved balance; scored 46 on BERG.  He has met LTG #1. Trialed Ai Chi exercises today.   He reported sharp pain in low back with soothing, gathering, and freeing Ai Chi postures at 4+ ft water depth; limited reps and encouraged pt to inform therapist during exercise if there's pain. With rest, back pain subsides in water. Progressing well towards remaining LTGs.     Progress: pt making excellent progress having met all of his STG and 3/5 LTG.  He reports decreased pain and improved toleration to activity. With his improved balance ability he has a decreased fall risk and his perception of his ability have improved.  He will continue to benefit from skilled phycal therapy with combination of aquatic and land sessions to further meet all goals and ensure indep  with HEP for continued engagement upon DC. FZ   OBJECTIVE IMPAIRMENTS Abnormal gait, decreased activity tolerance, decreased balance, decreased endurance, decreased mobility, difficulty walking, decreased ROM, decreased strength, impaired flexibility, impaired sensation, and pain.    ACTIVITY LIMITATIONS lifting, bending, stairs, and reach over head   PARTICIPATION LIMITATIONS: cleaning, community activity, and yard work   PERSONAL FACTORS Age, Past/current experiences, Time since onset of injury/illness/exacerbation, and 3+ comorbidities: Left rotator cuff and bicep tendon tears, Cervical Spondylosis, DM, HTN, Lumbar fusion, chronic pain  are also affecting patient's functional outcome.    REHAB POTENTIAL: Good   CLINICAL DECISION MAKING: Unstable/unpredictable   EVALUATION COMPLEXITY: High     GOALS: Goals reviewed with patient? Yes   SHORT  TERM GOALS: Target date: 08/27/2021   Pt will acquire cane; it will be properly adjusted to height with instruction on coordinated use to improve toleration to amb/less back fatigue Baseline: Goal status: Achieved 08/25/21   2.  Increased amb distance without  back fatigue 1000 ft to and from aquatics Baseline: 581f Goal status: Achieved 08/25/21   3.  Decrease mid thoracic pain< 4/10 Baseline: 3-4/10 Goal status: Achieved 08/25/21     LONG TERM GOALS: Target date: 10/22/2021   BMerrilee Janskybalance test to improve to at least 35/56 ([facility] to demonstrate decreased fall risk Baseline: 28/56 Goal status: Achieved 09/03/21   2.  Foto to predicted 36 or greater Baseline: 28 Goal status:Achieved 08/20/21   3.  LE hip strength to 5/5 to demonstrate decreased pain in LB and improved strength Baseline: 4/5 limited to pain Goal status: INITIAL   4.  Pt will improve hamstring length to allow for indep and easy and safe donning and doffing of shoes and socks in seated position Baseline: with difficulty in standing position at eval; can don shoes in seated  position Goal status: Partially Met    5.  Pt will report no falls since onset of skilled physical therapy Baseline: 2 last 5 months Goal status: ONGOING          PLAN: PT FREQUENCY: 1-2x/week   PT DURATION: 12 weeks   PLANNED INTERVENTIONS: Therapeutic exercises, Therapeutic activity, Neuromuscular re-education, Balance training, Gait training, Patient/Family education, Joint mobilization, Stair training, DME instructions, Aquatic Therapy, Electrical stimulation, and Taping.   PLAN FOR NEXT SESSION: aquatic therapy for stretching LE and core, strengthening core, balance retraining.    JKerin Perna PTA 09/03/21 8:39 AM  Addended 09/10/21 15:51p Mary ([email or web address]

## 2021-09-08 ENCOUNTER — Encounter (HOSPITAL_BASED_OUTPATIENT_CLINIC_OR_DEPARTMENT_OTHER): Payer: Self-pay | Admitting: Physical Therapy

## 2021-09-08 ENCOUNTER — Ambulatory Visit (HOSPITAL_BASED_OUTPATIENT_CLINIC_OR_DEPARTMENT_OTHER): Payer: Medicare Other | Attending: Family Medicine | Admitting: Physical Therapy

## 2021-09-08 DIAGNOSIS — G894 Chronic pain syndrome: Secondary | ICD-10-CM | POA: Insufficient documentation

## 2021-09-08 DIAGNOSIS — R2681 Unsteadiness on feet: Secondary | ICD-10-CM | POA: Insufficient documentation

## 2021-09-08 DIAGNOSIS — R293 Abnormal posture: Secondary | ICD-10-CM | POA: Diagnosis present

## 2021-09-08 NOTE — Therapy (Signed)
OUTPATIENT PHYSICAL THERAPY TREATMENT NOTE   Patient Name: Nicholas Morse MRN: [id number] DOB:1956-02-25, 66 y.o., male Today's Date: 09/08/2021   PT End of Session - 09/08/21 0801     Visit Number 11    Number of Visits 24    Date for PT Re-Evaluation 10/22/21    Authorization Type medicare    Progress Note Due on Visit 20    PT Start Time 0758    PT Stop Time 0841    PT Time Calculation (min) 43 min    Activity Tolerance Patient tolerated treatment well    Behavior During Therapy WFL for tasks assessed/performed              Past Medical History:  Diagnosis Date   Anxiety    Arthritis    DJD (degenerative joint disease)    Hx of seasonal allergies    PMH   Hypothyroidism    Insomnia    Nocturia    Numbness and tingling    B/LLE   Panic attacks    PONV (postoperative nausea and vomiting)    Past Surgical History:  Procedure Laterality Date   BACK SURGERY     CARPAL TUNNEL RELEASE     B/L   COLONOSCOPY     FRACTURE SURGERY     Right knee   KNEE SURGERY     LCL rupture   MAXIMUM ACCESS (MAS)POSTERIOR LUMBAR INTERBODY FUSION (PLIF) 2 LEVEL N/A 11/21/2013   Procedure: FOR MAXIMUM ACCESS (MAS) POSTERIOR LUMBAR INTERBODY FUSION LUMBAR FOUR-FIVE, LUMBAR FIVE-SACRAL ONE WITH REDO DECOMPRESSION;  Surgeon: Erline Levine, MD;  Location: [place] [facility];  Service: Neurosurgery;  Laterality: N/A;   MUSCLE REPAIR     Bicept and Tricept rupture repair surgery   Patient Active Problem List   Diagnosis Date Noted   Spondylolisthesis of lumbar region 11/21/2013     PCP: Neila Gear, MD   REFERRING PROVIDER: Simona Huh, NP   REFERRING DIAG: G89.4 (ICD-10-CM) - Chronic pain syndrome   Rationale for Evaluation and Treatment Rehabilitation   THERAPY DIAG:  Chronic pain syndrome   Unsteadiness on feet   Abnormal posture   ONSET DATE: >10 yrs   SUBJECTIVE:                                                                                                                                                                                             SUBJECTIVE STATEMENT: Pt reports his back feels better than last week.  Soreness from last week resolved by next day.  He is interested in doing strengthening on land soon.  He has not checked into  [place] yet.    PERTINENT HISTORY:  L shoulder rotator cuff tear with bicep tendon rupture without repair >+5 yrs ago. ROM and strength limited Spinal stimulator DM HTN   PAIN:  Are you having pain? Yes NPRS scale:2/10 Pain location: lower thoracic spine/lumbar Pain description: stabbing, ache, tight Aggravating factors: bending, twisting, sneezing Relieving factors: stimulator, stretches     PRECAUTIONS: Fall   WEIGHT BEARING RESTRICTIONS No   FALLS:  Has patient fallen in last 6 months? Yes. Number of falls 2   LIVING ENVIRONMENT: Lives with: lives with their family Lives in: House/apartment Stairs: Yes: Internal: 18 steps; can reach both Has following equipment at home: None   OCCUPATION:      PLOF: Independent   PATIENT GOALS Increase flexability     OBJECTIVE:  * Findings taken at [facility] unless otherwise noted.   DIAGNOSTIC FINDINGS:  MRI cervical spine: Cervical Spondylosis 1. 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 2. Mildly worsened moderate right C6-7 neural foraminal stenosis. 3. Unchanged moderate bilateral C3-4 and left C5-6 neural foraminal stenosis. 4. No spinal canal stenosis.   PATIENT SURVEYS:  FOTO 28 with predicted 8 point improvement to 36 08/20/21 45   SCREENING FOR RED FLAGS: Neg for all   COGNITION:            Overall cognitive status: Within functional limits for tasks assessed                          SENSATION: Peripheral neuropathies: bilat feet decreased sensation to light touch; hot/cold   MUSCLE LENGTH: Hamstrings shortened.  Unable to test due to not tolerating   POSTURE: rounded shoulders, forward head, and decreased lumbar lordosis    PALPATION: Minimally tender lumbar and low thoracic paraspinals upper sacrum   LUMBAR ROM:  Limited 80% in all directions Pain with movement > on LvsR     LOWER EXTREMITY ROM:                WFL   LOWER EXTREMITY MMT:     MMT Right eval Left eval  Hip flexion 4 Seated 4 seated  Hip extension      Hip abduction 5 5  Hip adduction 5 5  Hip internal rotation      Hip external rotation      Knee flexion 5 5  Knee extension 5 5  Ankle dorsiflexion 5 5  Ankle plantarflexion      Ankle inversion      Ankle eversion       (Blank rows = not tested)   LUMBAR SPECIAL TESTS:  Slump test: Negative   FUNCTIONAL TESTS:  5 times sit to stand: 29s Timed up and go (TUG): 20 Berg Balance Scale: 28/56  Berg Balance                                                              07/30/21   09/03/21 Sit to stand 2 4  Standing unsupported 4 4  Sitting with back unsupported but feet supported 4 4  Stand to sit  2 4  Transfers  3 4  Standing unsupported with eyes closed 3 4  Standing unsupported feet together 3 4  From standing position, reach forward with outstretched arm  2 3  From standing position, pick up object from floor 1 3  From standing position, turn and look behind over each shoulder 2 2  Turn 360 1 2  Standing unsupported, alternately place foot on step 1 4  Standing unsupported, one foot in front 0 3  Standing on one leg 0 1  Total:  28 46          GAIT: Distance walked: 500 Assistive device utilized: None Level of assistance: Complete Independence Comments:    TODAY'S TREATMENT  Pt seen for aquatic therapy today.  Treatment took place in water 3.25-4.8 ft in depth at the [facility] pool. Temp of water was 91.  Pt entered/exited the pool via stairs independently with bilat rail.  Warm up of laps both forward and backward walking, side stepping - various speeds Forward Step ups on 2nd step x 5 each LE, UE on rails Wood chop with yellow hand buoy x 5 x  2 Monster walk forward/ backward with coordinating arms Ai Chi: floating, enclosing, folding, soothing( 5 each), gathering (5 each), freeing (5 each side-improved tolerance) Walking forward for rest and recovery followed by Low back stretch holding wall Curtsy holding yellow noodle; repeated with added knee to noodle  Warrior 1 lifting noodle off surface of water , 5 reps each side 2 step box step R/L with cues for increased speed at <4 ft Chest stretch, holding yellow noodle behind back Seated on 4th step: STS x 5 reps Holding rails at stairs:  hamstring stretch with foot on 2nd step; low back flexion stretch; fig 4 piriformis stretch    Pt requires buoyancy for support and to offload joints with strengthening exercises. Viscosity of the water is needed for resistance of strengthening; water current perturbations provides challenge to standing balance unsupported, requiring increased core activation.     PATIENT EDUCATION:  Education details: benefits of aquatic therapy, properties of water. Person educated: Patient Education method: Explanation Education comprehension: verbalized understanding     HOME EXERCISE PROGRAM: To be established   ASSESSMENT:   CLINICAL IMPRESSION: Pt reported no sharp pain in low back with soothing, gathering, and freeing Ai Chi postures at 4+ ft water depth. Some difficulty with forward step ups with increased step height.  He reported reduction of stiffness at end of session.  Progressing well towards remaining LTGs.   Plan to see on land next visit to begin transitioning to land program.     OBJECTIVE IMPAIRMENTS Abnormal gait, decreased activity tolerance, decreased balance, decreased endurance, decreased mobility, difficulty walking, decreased ROM, decreased strength, impaired flexibility, impaired sensation, and pain.    ACTIVITY LIMITATIONS lifting, bending, stairs, and reach over head   PARTICIPATION LIMITATIONS: cleaning, community activity,  and yard work   PERSONAL FACTORS Age, Past/current experiences, Time since onset of injury/illness/exacerbation, and 3+ comorbidities: Left rotator cuff and bicep tendon tears, Cervical Spondylosis, DM, HTN, Lumbar fusion, chronic pain  are also affecting patient's functional outcome.    REHAB POTENTIAL: Good   CLINICAL DECISION MAKING: Unstable/unpredictable   EVALUATION COMPLEXITY: High     GOALS: Goals reviewed with patient? Yes   SHORT TERM GOALS: Target date: 08/27/2021   Pt will acquire cane; it will be properly adjusted to height with instruction on coordinated use to improve toleration to amb/less back fatigue Baseline: Goal status: Achieved 08/25/21   2.  Increased amb distance without  back fatigue 1000 ft to and from aquatics Baseline: 558f Goal status: Achieved 08/25/21   3.  Decrease  mid thoracic pain< 4/10 Baseline: 3-4/10 Goal status: Achieved 08/25/21     LONG TERM GOALS: Target date: 10/22/2021   Merrilee Jansky balance test to improve to at least 35/56 [facility]) to demonstrate decreased fall risk Baseline: 28/56 Goal status: Achieved 09/03/21   2.  Foto to predicted 36 or greater Baseline: 28 Goal status:Achieved 08/20/21   3.  LE hip strength to 5/5 to demonstrate decreased pain in LB and improved strength Baseline: 4/5 limited to pain Goal status: INITIAL   4.  Pt will improve hamstring length to allow for indep and easy and safe donning and doffing of shoes and socks in seated position Baseline: with difficulty in standing position at eval; can don shoes in seated position Goal status: Partially Met    5.  Pt will report no falls since onset of skilled physical therapy Baseline: 2 last 5 months Goal status: ONGOING          PLAN: PT FREQUENCY: 1-2x/week   PT DURATION: 12 weeks   PLANNED INTERVENTIONS: Therapeutic exercises, Therapeutic activity, Neuromuscular re-education, Balance training, Gait training, Patient/Family education, Joint mobilization, Stair  training, DME instructions, Aquatic Therapy, Electrical stimulation, and Taping.   PLAN FOR NEXT SESSION: assess strength and FOTO; trial land strengthening/ core; aquatic therapy for stretching LE and core, strengthening core, balance retraining.    Kerin Perna, PTA 09/08/21 9:30 AM

## 2021-09-10 ENCOUNTER — Ambulatory Visit (HOSPITAL_BASED_OUTPATIENT_CLINIC_OR_DEPARTMENT_OTHER): Payer: Medicare Other | Admitting: Physical Therapy

## 2021-09-10 ENCOUNTER — Encounter (HOSPITAL_BASED_OUTPATIENT_CLINIC_OR_DEPARTMENT_OTHER): Payer: Self-pay | Admitting: Physical Therapy

## 2021-09-10 DIAGNOSIS — G894 Chronic pain syndrome: Secondary | ICD-10-CM | POA: Diagnosis not present

## 2021-09-10 DIAGNOSIS — R2681 Unsteadiness on feet: Secondary | ICD-10-CM

## 2021-09-10 DIAGNOSIS — R293 Abnormal posture: Secondary | ICD-10-CM

## 2021-09-10 NOTE — Therapy (Signed)
OUTPATIENT PHYSICAL THERAPY TREATMENT NOTE   Patient Name: Nicholas Morse MRN: [id number] DOB:[birth year], 66 y.o., male Today's Date: 09/10/2021   PT End of Session - 09/10/21 0819     Visit Number 12    Number of Visits 24    Date for PT Re-Evaluation 10/22/21    Authorization Type medicare    Progress Note Due on Visit 20    PT Start Time 0817    PT Stop Time 0855    PT Time Calculation (min) 38 min    Activity Tolerance Patient tolerated treatment well    Behavior During Therapy WFL for tasks assessed/performed              Past Medical History:  Diagnosis Date   Anxiety    Arthritis    DJD (degenerative joint disease)    Hx of seasonal allergies    PMH   Hypothyroidism    Insomnia    Nocturia    Numbness and tingling    B/LLE   Panic attacks    PONV (postoperative nausea and vomiting)    Past Surgical History:  Procedure Laterality Date   BACK SURGERY     CARPAL TUNNEL RELEASE     B/L   COLONOSCOPY     FRACTURE SURGERY     Right knee   KNEE SURGERY     LCL rupture   MAXIMUM ACCESS (MAS)POSTERIOR LUMBAR INTERBODY FUSION (PLIF) 2 LEVEL N/A 11/21/2013   Procedure: FOR MAXIMUM ACCESS (MAS) POSTERIOR LUMBAR INTERBODY FUSION LUMBAR FOUR-FIVE, LUMBAR FIVE-SACRAL ONE WITH REDO DECOMPRESSION;  Surgeon: Erline Levine, MD;  Location: [facility] [facility];  Service: Neurosurgery;  Laterality: N/A;   MUSCLE REPAIR     Bicept and Tricept rupture repair surgery   Patient Active Problem List   Diagnosis Date Noted   Spondylolisthesis of lumbar region 11/21/2013     PCP: Neila Gear, MD   REFERRING PROVIDER: Simona Huh, NP   REFERRING DIAG: G89.4 (ICD-10-CM) - Chronic pain syndrome   Rationale for Evaluation and Treatment Rehabilitation   THERAPY DIAG:  Chronic pain syndrome   Unsteadiness on feet   Abnormal posture   ONSET DATE: >10 yrs   SUBJECTIVE:                                                                                                                                                                                             SUBJECTIVE STATEMENT: Pt reports his back feels better than last week.  Soreness from last week resolved by next day.  He is interested in doing strengthening on land soon.  He has not checked into  [facility] yet.    PERTINENT HISTORY:  L shoulder rotator cuff tear with bicep tendon rupture without repair >+5 yrs ago. ROM and strength limited Spinal stimulator DM HTN   PAIN:  Are you having pain? Yes NPRS scale:2/10 Pain location: lower thoracic spine/lumbar Pain description: stabbing, ache, tight Aggravating factors: bending, twisting, sneezing Relieving factors: stimulator, stretches     PRECAUTIONS: Fall   WEIGHT BEARING RESTRICTIONS No   FALLS:  Has patient fallen in last 6 months? Yes. Number of falls 2   LIVING ENVIRONMENT: Lives with: lives with their family Lives in: House/apartment Stairs: Yes: Internal: 18 steps; can reach both Has following equipment at home: None   OCCUPATION:      PLOF: Independent   PATIENT GOALS Increase flexability     OBJECTIVE:  * Findings taken at [facility] unless otherwise noted.   DIAGNOSTIC FINDINGS:  MRI cervical spine: Cervical Spondylosis 1. 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 2. Mildly worsened moderate right C6-7 neural foraminal stenosis. 3. Unchanged moderate bilateral C3-4 and left C5-6 neural foraminal stenosis. 4. No spinal canal stenosis.   PATIENT SURVEYS:  FOTO 28 with predicted 8 point improvement to 36 08/20/21 45   SCREENING FOR RED FLAGS: Neg for all   COGNITION:            Overall cognitive status: Within functional limits for tasks assessed                          SENSATION: Peripheral neuropathies: bilat feet decreased sensation to light touch; hot/cold   MUSCLE LENGTH: Hamstrings shortened.  Unable to test due to not tolerating   POSTURE: rounded shoulders, forward head, and decreased lumbar lordosis    PALPATION: Minimally tender lumbar and low thoracic paraspinals upper sacrum   LUMBAR ROM:  Limited 80% in all directions Pain with movement > on LvsR     LOWER EXTREMITY ROM:                WFL   LOWER EXTREMITY MMT:     MMT Right eval Left eval Right  09/10/21 Left 09/10/21  Hip flexion 4 Seated 4 seated 5/5 seated 4+/5 seated  Hip extension        Hip abduction 5 5    Hip adduction 5 5    Hip internal rotation        Hip external rotation        Knee flexion 5 5    Knee extension 5 5    Ankle dorsiflexion 5 5    Ankle plantarflexion        Ankle inversion        Ankle eversion         (Blank rows = not tested)   LUMBAR SPECIAL TESTS:  Slump test: Negative   FUNCTIONAL TESTS:  5 times sit to stand: 29s Timed up and go (TUG): 20 Berg Balance Scale: 28/56  Berg Balance                                                              07/30/21   09/03/21 Sit to stand 2 4  Standing unsupported 4 4  Sitting with back unsupported but feet supported 4 4  Stand to sit  2 4  Transfers  3 4  Standing unsupported with eyes closed 3 4  Standing unsupported feet together 3 4  From standing position, reach forward with outstretched arm 2 3  From standing position, pick up object from floor 1 3  From standing position, turn and look behind over each shoulder 2 2  Turn 360 1 2  Standing unsupported, alternately place foot on step 1 4  Standing unsupported, one foot in front 0 3  Standing on one leg 0 1  Total:  28 46          GAIT: Distance walked: 500 Assistive device utilized: None Level of assistance: Complete Independence Comments:    TODAY'S TREATMENT  Therapeutic exercise:  NuStep L5: 5 min for warm up with arms/legs Leg press 40# x 10, 55# x 10 Lat pull down 40# x 12 x 2 Row 40# x 10, 55# x 10 - with 3 sec pause in retraction Triceps press 55# x 12, x 10 Overhead press 10# x 8 reps (fatigues)  Chest press 40# x 10, 25# x 5 (improved tolerance) Seated hip  abdct 40# x 20 55#  x10 Tandem stance without UE support x 15s each; walking to recover L stretch at sink, 10sec x 3 Seated hamstring 15s each Seated piriformis 15s each      PATIENT EDUCATION:  Education details: benefits of aquatic therapy, properties of water. Person educated: Patient Education method: Explanation Education comprehension: verbalized understanding     HOME EXERCISE PROGRAM: To be established   ASSESSMENT:   CLINICAL IMPRESSION: Pt seen on land to review some gym exercises to complete (and verbally reviewed those to avoid). Cues for core engagement throughout strengthening exercises. He reported some increase in low back tightness when he didn't have back positioned correctly and abs engaged; resolved with position adjustments. Progressing well towards remaining LTGs.    OBJECTIVE IMPAIRMENTS Abnormal gait, decreased activity tolerance, decreased balance, decreased endurance, decreased mobility, difficulty walking, decreased ROM, decreased strength, impaired flexibility, impaired sensation, and pain.    ACTIVITY LIMITATIONS lifting, bending, stairs, and reach over head   PARTICIPATION LIMITATIONS: cleaning, community activity, and yard work   PERSONAL FACTORS Age, Past/current experiences, Time since onset of injury/illness/exacerbation, and 3+ comorbidities: Left rotator cuff and bicep tendon tears, Cervical Spondylosis, DM, HTN, Lumbar fusion, chronic pain  are also affecting patient's functional outcome.    REHAB POTENTIAL: Good   CLINICAL DECISION MAKING: Unstable/unpredictable   EVALUATION COMPLEXITY: High     GOALS: Goals reviewed with patient? Yes   SHORT TERM GOALS: Target date: 08/27/2021   Pt will acquire cane; it will be properly adjusted to height with instruction on coordinated use to improve toleration to amb/less back fatigue Baseline: Goal status: Achieved 08/25/21   2.  Increased amb distance without  back fatigue 1000 ft to and from  aquatics Baseline: 565f Goal status: Achieved 08/25/21   3.  Decrease mid thoracic pain< 4/10 Baseline: 3-4/10 Goal status: Achieved 08/25/21     LONG TERM GOALS: Target date: 10/22/2021   BMerrilee Janskybalance test to improve to at least 35/56 ([facility] to demonstrate decreased fall risk Baseline: 28/56 Goal status: Achieved 09/03/21   2.  Foto to predicted 36 or greater Baseline: 28 Goal status:Achieved 08/20/21   3.  LE hip strength to 5/5 to demonstrate decreased pain in LB and improved strength Baseline: 4/5 limited to pain Goal status: INITIAL   4.  Pt will improve hamstring length to allow for indep and easy  and safe donning and doffing of shoes and socks in seated position Baseline: with difficulty in standing position at eval; can don shoes in seated position Goal status: Partially Met    5.  Pt will report no falls since onset of skilled physical therapy Baseline: 2 last 5 months Goal status: ONGOING          PLAN: PT FREQUENCY: 1-2x/week   PT DURATION: 12 weeks   PLANNED INTERVENTIONS: Therapeutic exercises, Therapeutic activity, Neuromuscular re-education, Balance training, Gait training, Patient/Family education, Joint mobilization, Stair training, DME instructions, Aquatic Therapy, Electrical stimulation, and Taping.   PLAN FOR NEXT SESSION: land strengthening/ core; aquatic therapy for stretching LE and core, strengthening core, balance retraining.    Kerin Perna, PTA 09/10/21 12:54 PM

## 2021-09-15 ENCOUNTER — Encounter (HOSPITAL_BASED_OUTPATIENT_CLINIC_OR_DEPARTMENT_OTHER): Payer: Self-pay | Admitting: Physical Therapy

## 2021-09-15 ENCOUNTER — Ambulatory Visit (HOSPITAL_BASED_OUTPATIENT_CLINIC_OR_DEPARTMENT_OTHER): Payer: Medicare Other | Admitting: Physical Therapy

## 2021-09-15 DIAGNOSIS — R293 Abnormal posture: Secondary | ICD-10-CM

## 2021-09-15 DIAGNOSIS — R2681 Unsteadiness on feet: Secondary | ICD-10-CM

## 2021-09-15 DIAGNOSIS — G894 Chronic pain syndrome: Secondary | ICD-10-CM

## 2021-09-15 NOTE — Therapy (Signed)
OUTPATIENT PHYSICAL THERAPY TREATMENT NOTE   Patient Name: Nicholas Morse MRN: [id number] DOB:11-04-1955, 66 y.o., male Today's Date: 09/15/2021   PT End of Session - 09/15/21 1453     Visit Number 13    Number of Visits 24    Date for PT Re-Evaluation 10/22/21    Authorization Type medicare    Progress Note Due on Visit 20    PT Start Time 1345    PT Stop Time 1425    PT Time Calculation (min) 40 min    Activity Tolerance Patient tolerated treatment well    Behavior During Therapy WFL for tasks assessed/performed               Past Medical History:  Diagnosis Date   Anxiety    Arthritis    DJD (degenerative joint disease)    Hx of seasonal allergies    PMH   Hypothyroidism    Insomnia    Nocturia    Numbness and tingling    B/LLE   Panic attacks    PONV (postoperative nausea and vomiting)    Past Surgical History:  Procedure Laterality Date   BACK SURGERY     CARPAL TUNNEL RELEASE     B/L   COLONOSCOPY     FRACTURE SURGERY     Right knee   KNEE SURGERY     LCL rupture   MAXIMUM ACCESS (MAS)POSTERIOR LUMBAR INTERBODY FUSION (PLIF) 2 LEVEL N/A 11/21/2013   Procedure: FOR MAXIMUM ACCESS (MAS) POSTERIOR LUMBAR INTERBODY FUSION LUMBAR FOUR-FIVE, LUMBAR FIVE-SACRAL ONE WITH REDO DECOMPRESSION;  Surgeon: Erline Levine, MD;  Location: [place] [facility];  Service: Neurosurgery;  Laterality: N/A;   MUSCLE REPAIR     Bicept and Tricept rupture repair surgery   Patient Active Problem List   Diagnosis Date Noted   Spondylolisthesis of lumbar region 11/21/2013     PCP: Neila Gear, MD   REFERRING PROVIDER: Simona Huh, NP   REFERRING DIAG: G89.4 (ICD-10-CM) - Chronic pain syndrome   Rationale for Evaluation and Treatment Rehabilitation   THERAPY DIAG:  Chronic pain syndrome   Unsteadiness on feet   Abnormal posture   ONSET DATE: >10 yrs   SUBJECTIVE:                                                                                                                                                                                             SUBJECTIVE STATEMENT: Pt states he was generally sore from last session and working out "muscles that hadn't been worked in a long time," but no increase in pain. Pt states his balance is very bad  and he would like to work on balance as well as stretching program.   Ablation is on Thursday for L2-4  Fused L4-S1   PERTINENT HISTORY:  L shoulder rotator cuff tear with bicep tendon rupture without repair >+5 yrs ago. ROM and strength limited Spinal stimulator DM HTN   PAIN:  Are you having pain? Yes NPRS scale: 3/10 Pain location: lower thoracic spine/lumbar Pain description: stabbing, ache, tight Aggravating factors: bending, twisting, sneezing Relieving factors: stimulator, stretches     PRECAUTIONS: Fall   WEIGHT BEARING RESTRICTIONS No   FALLS:  Has patient fallen in last 6 months? Yes. Number of falls 2   LIVING ENVIRONMENT: Lives with: lives with their family Lives in: House/apartment Stairs: Yes: Internal: 18 steps; can reach both Has following equipment at home: None   OCCUPATION:      PLOF: Independent   PATIENT GOALS Increase flexability     OBJECTIVE:  * Findings taken at [facility] unless otherwise noted.   DIAGNOSTIC FINDINGS:  MRI cervical spine: Cervical Spondylosis 1. 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 2. Mildly worsened moderate right C6-7 neural foraminal stenosis. 3. Unchanged moderate bilateral C3-4 and left C5-6 neural foraminal stenosis. 4. No spinal canal stenosis.   PATIENT SURVEYS:  FOTO 28 with predicted 8 point improvement to 36 08/20/21 45   SCREENING FOR RED FLAGS: Neg for all   COGNITION:            Overall cognitive status: Within functional limits for tasks assessed                          SENSATION: Peripheral neuropathies: bilat feet decreased sensation to light touch; hot/cold   MUSCLE LENGTH: Hamstrings shortened.  Unable to  test due to not tolerating   POSTURE: rounded shoulders, forward head, and decreased lumbar lordosis   PALPATION: Minimally tender lumbar and low thoracic paraspinals upper sacrum   LUMBAR ROM:  Limited 80% in all directions Pain with movement > on LvsR     LOWER EXTREMITY ROM:                WFL   LOWER EXTREMITY MMT:     MMT Right eval Left eval Right  09/10/21 Left 09/10/21  Hip flexion 4 Seated 4 seated 5/5 seated 4+/5 seated  Hip extension        Hip abduction 5 5    Hip adduction 5 5    Hip internal rotation        Hip external rotation        Knee flexion 5 5    Knee extension 5 5    Ankle dorsiflexion 5 5    Ankle plantarflexion        Ankle inversion        Ankle eversion         (Blank rows = not tested)   LUMBAR SPECIAL TESTS:  Slump test: Negative   FUNCTIONAL TESTS:  5 times sit to stand: 29s Timed up and go (TUG): 20 Berg Balance Scale: 28/56  Berg Balance                                                              07/30/21   09/03/21 Sit to stand  2 4  Standing unsupported 4 4  Sitting with back unsupported but feet supported 4 4  Stand to sit  2 4  Transfers  3 4  Standing unsupported with eyes closed 3 4  Standing unsupported feet together 3 4  From standing position, reach forward with outstretched arm 2 3  From standing position, pick up object from floor 1 3  From standing position, turn and look behind over each shoulder 2 2  Turn 360 1 2  Standing unsupported, alternately place foot on step 1 4  Standing unsupported, one foot in front 0 3  Standing on one leg 0 1  Total:  28 46      TODAY'S TREATMENT   7/10  NuStep L5: 5 min for warm up with arms/legs Seated piriformis stretch 30s 2x Seated HS stretch 30s 2x STS from low table 10lbs 3x5 Paloff Press GTB 2x10  Sidestepping RTB at knees 3x length of bar Tandem Balance 30s 3x Seated flexion lumbar stretch 5s 10x with rolling stool      Previous:  Therapeutic exercise:   NuStep L5: 5 min for warm up with arms/legs Leg press 40# x 10, 55# x 10 Lat pull down 40# x 12 x 2 Row 40# x 10, 55# x 10 - with 3 sec pause in retraction Triceps press 55# x 12, x 10 Overhead press 10# x 8 reps (fatigues)  Chest press 40# x 10, 25# x 5 (improved tolerance) Seated hip abdct 40# x 20 55#  x10 Tandem stance without UE support x 15s each; walking to recover L stretch at sink, 10sec x 3 Seated hamstring 15s each Seated piriformis 15s each      PATIENT EDUCATION:  Education details: anatomy, exercise progression, DOMS expectations, muscle firing,  envelope of function, HEP, POC Person educated: Patient Education method: Explanation Education comprehension: verbalized understanding     HOME EXERCISE PROGRAM: Access Code: [email or web address] URL: [email or web address] Date: 09/15/2021 Prepared by: Daleen Bo  Exercises - Standing Anti-Rotation Press with Anchored Resistance  - 1 x daily - 2 x weekly - 2 sets - 10 reps - Side Stepping with Resistance at Thighs  - 1 x daily - 2 x weekly - 3 sets - 10 reps - Sit to Stand  - 1 x daily - 2 x weekly - 3 sets - 10 reps - Standing Tandem Balance with Counter Support  - 1 x daily - 2 x weekly - 2 sets - 4 reps - 30 hold - Seated Figure 4 Piriformis Stretch  - 2 x daily - 7 x weekly - 1 sets - 3 reps - 30 hold - Seated Hamstring Stretch  - 2 x daily - 7 x weekly - 1 sets - 3 reps - 30 hold  ASSESSMENT:   CLINICAL IMPRESSION: Pt able to continue with progress of land based strength and stability. Pt does have land based movement deficits with standing activity due to decreased hip and trunk motor control. Pt without pain during session today and HEP updated. Pt advised on keeping frequency low at this point in order to reduce overuse related pain. Pt to continue with aquatic and land visits for transition to independent exercise. Pt would benefit from continued skilled therapy in order to reach goals and maximize functional  lumbopelvic strength and ROM for prevention of further functional decline.   OBJECTIVE IMPAIRMENTS Abnormal gait, decreased activity tolerance, decreased balance, decreased endurance, decreased mobility, difficulty walking, decreased ROM, decreased strength, impaired flexibility, impaired sensation, and  pain.    ACTIVITY LIMITATIONS lifting, bending, stairs, and reach over head   PARTICIPATION LIMITATIONS: cleaning, community activity, and yard work   [place] Age, Past/current experiences, Time since onset of injury/illness/exacerbation, and 3+ comorbidities: Left rotator cuff and bicep tendon tears, Cervical Spondylosis, DM, HTN, Lumbar fusion, chronic pain  are also affecting patient's functional outcome.    REHAB POTENTIAL: Good   CLINICAL DECISION MAKING: Unstable/unpredictable   EVALUATION COMPLEXITY: High     GOALS: Goals reviewed with patient? Yes   SHORT TERM GOALS: Target date: 08/27/2021   Pt will acquire cane; it will be properly adjusted to height with instruction on coordinated use to improve toleration to amb/less back fatigue Baseline: Goal status: Achieved 08/25/21   2.  Increased amb distance without  back fatigue 1000 ft to and from aquatics Baseline: 577f Goal status: Achieved 08/25/21   3.  Decrease mid thoracic pain< 4/10 Baseline: 3-4/10 Goal status: Achieved 08/25/21     LONG TERM GOALS: Target date: 10/22/2021   BMerrilee Janskybalance test to improve to at least 35/56 ([facility] to demonstrate decreased fall risk Baseline: 28/56 Goal status: Achieved 09/03/21   2.  Foto to predicted 36 or greater Baseline: 28 Goal status:Achieved 08/20/21   3.  LE hip strength to 5/5 to demonstrate decreased pain in LB and improved strength Baseline: 4/5 limited to pain Goal status: INITIAL   4.  Pt will improve hamstring length to allow for indep and easy and safe donning and doffing of shoes and socks in seated position Baseline: with difficulty in standing position at  eval; can don shoes in seated position Goal status: Partially Met    5.  Pt will report no falls since onset of skilled physical therapy Baseline: 2 last 5 months Goal status: ONGOING          PLAN: PT FREQUENCY: 1-2x/week   PT DURATION: 12 weeks   PLANNED INTERVENTIONS: Therapeutic exercises, Therapeutic activity, Neuromuscular re-education, Balance training, Gait training, Patient/Family education, Joint mobilization, Stair training, DME instructions, Aquatic Therapy, Electrical stimulation, and Taping.   PLAN FOR NEXT SESSION: land strengthening/ core; aquatic therapy for stretching LE and core, strengthening core, balance retraining.    ADaleen BoPT, DPT 09/15/21 2:54 PM

## 2021-09-17 ENCOUNTER — Ambulatory Visit (HOSPITAL_BASED_OUTPATIENT_CLINIC_OR_DEPARTMENT_OTHER): Payer: Medicare Other | Admitting: Physical Therapy

## 2021-09-17 ENCOUNTER — Encounter (HOSPITAL_BASED_OUTPATIENT_CLINIC_OR_DEPARTMENT_OTHER): Payer: Self-pay | Admitting: Physical Therapy

## 2021-09-17 DIAGNOSIS — G894 Chronic pain syndrome: Secondary | ICD-10-CM | POA: Diagnosis not present

## 2021-09-17 DIAGNOSIS — R2681 Unsteadiness on feet: Secondary | ICD-10-CM

## 2021-09-17 DIAGNOSIS — R293 Abnormal posture: Secondary | ICD-10-CM

## 2021-09-17 NOTE — Therapy (Signed)
OUTPATIENT PHYSICAL THERAPY TREATMENT NOTE   Patient Name: Nicholas Morse MRN: [id number] DOB:24-May-1955, 66 y.o., male Today's Date: 09/17/2021   PT End of Session - 09/17/21 0811     Visit Number 14    Number of Visits 24    Date for PT Re-Evaluation 10/22/21    Authorization Type medicare    Authorization - Visit Number 10    Progress Note Due on Visit 20    PT Start Time 0815    PT Stop Time 0900    PT Time Calculation (min) 45 min               Past Medical History:  Diagnosis Date   Anxiety    Arthritis    DJD (degenerative joint disease)    Hx of seasonal allergies    PMH   Hypothyroidism    Insomnia    Nocturia    Numbness and tingling    B/LLE   Panic attacks    PONV (postoperative nausea and vomiting)    Past Surgical History:  Procedure Laterality Date   BACK SURGERY     CARPAL TUNNEL RELEASE     B/L   COLONOSCOPY     FRACTURE SURGERY     Right knee   KNEE SURGERY     LCL rupture   MAXIMUM ACCESS (MAS)POSTERIOR LUMBAR INTERBODY FUSION (PLIF) 2 LEVEL N/A 11/21/2013   Procedure: FOR MAXIMUM ACCESS (MAS) POSTERIOR LUMBAR INTERBODY FUSION LUMBAR FOUR-FIVE, LUMBAR FIVE-SACRAL ONE WITH REDO DECOMPRESSION;  Surgeon: Erline Levine, MD;  Location: [place] [facility];  Service: Neurosurgery;  Laterality: N/A;   MUSCLE REPAIR     Bicept and Tricept rupture repair surgery   Patient Active Problem List   Diagnosis Date Noted   Spondylolisthesis of lumbar region 11/21/2013     PCP: Neila Gear, MD   REFERRING PROVIDER: Simona Huh, NP   REFERRING DIAG: G89.4 (ICD-10-CM) - Chronic pain syndrome   Rationale for Evaluation and Treatment Rehabilitation   THERAPY DIAG:  Chronic pain syndrome   Unsteadiness on feet   Abnormal posture   ONSET DATE: >10 yrs   SUBJECTIVE:                                                                                                                                                                                             SUBJECTIVE STATEMENT: Pt states he was generally sore from last session and working out "muscles that hadn't been worked in a long time," but no increase in pain. Pt states his balance is very bad and he would like to work on balance as well as  stretching program.   Ablation is on Thursday for L2-4  Fused L4-S1   PERTINENT HISTORY:  L shoulder rotator cuff tear with bicep tendon rupture without repair >+5 yrs ago. ROM and strength limited Spinal stimulator DM HTN   PAIN:  Are you having pain? Yes NPRS scale: 3/10 Pain location: lower thoracic spine/lumbar Pain description: stabbing, ache, tight Aggravating factors: bending, twisting, sneezing Relieving factors: stimulator, stretches     PRECAUTIONS: Fall   WEIGHT BEARING RESTRICTIONS No   FALLS:  Has patient fallen in last 6 months? Yes. Number of falls 2   LIVING ENVIRONMENT: Lives with: lives with their family Lives in: House/apartment Stairs: Yes: Internal: 18 steps; can reach both Has following equipment at home: None   OCCUPATION:      PLOF: Independent   PATIENT GOALS Increase flexability     OBJECTIVE:  * Findings taken at [facility] unless otherwise noted.   DIAGNOSTIC FINDINGS:  MRI cervical spine: Cervical Spondylosis 1. 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 2. Mildly worsened moderate right C6-7 neural foraminal stenosis. 3. Unchanged moderate bilateral C3-4 and left C5-6 neural foraminal stenosis. 4. No spinal canal stenosis.   PATIENT SURVEYS:  FOTO 28 with predicted 8 point improvement to 36 08/20/21 45   SCREENING FOR RED FLAGS: Neg for all   COGNITION:            Overall cognitive status: Within functional limits for tasks assessed                          SENSATION: Peripheral neuropathies: bilat feet decreased sensation to light touch; hot/cold   MUSCLE LENGTH: Hamstrings shortened.  Unable to test due to not tolerating   POSTURE: rounded shoulders, forward head, and  decreased lumbar lordosis   PALPATION: Minimally tender lumbar and low thoracic paraspinals upper sacrum   LUMBAR ROM:  Limited 80% in all directions Pain with movement > on LvsR     LOWER EXTREMITY ROM:                WFL   LOWER EXTREMITY MMT:     MMT Right eval Left eval Right  09/10/21 Left 09/10/21  Hip flexion 4 Seated 4 seated 5/5 seated 4+/5 seated  Hip extension        Hip abduction 5 5    Hip adduction 5 5    Hip internal rotation        Hip external rotation        Knee flexion 5 5    Knee extension 5 5    Ankle dorsiflexion 5 5    Ankle plantarflexion        Ankle inversion        Ankle eversion         (Blank rows = not tested)   LUMBAR SPECIAL TESTS:  Slump test: Negative   FUNCTIONAL TESTS:  5 times sit to stand: 29s Timed up and go (TUG): 20 Berg Balance Scale: 28/56  Berg Balance                                                              07/30/21   09/03/21 Sit to stand 2 4  Standing unsupported 4 4  Sitting with back  unsupported but feet supported 4 4  Stand to sit  2 4  Transfers  3 4  Standing unsupported with eyes closed 3 4  Standing unsupported feet together 3 4  From standing position, reach forward with outstretched arm 2 3  From standing position, pick up object from floor 1 3  From standing position, turn and look behind over each shoulder 2 2  Turn 360 1 2  Standing unsupported, alternately place foot on step 1 4  Standing unsupported, one foot in front 0 3  Standing on one leg 0 1  Total:  28 46      TODAY'S TREATMENT   7/12 Pt seen for aquatic therapy today.  Water 3.25-4.8 ft in depth . Temp 91.  Pt entered/exited the pool via stairs step through pattern independently   Walking forward, back multiple widths LB stretching at steps with bilat rail ue supported by hand railss 3-4 x20s; Also completed between exercises. STS from 4th step (bottom) 11.5 lb buoyancy ball 3x5 STS 3rd step with adductor set squeezing BB 2x5.   Required extra buoyancy to complete Tandem stance ue supported on hand buoys progressed to unsupported SLS ue supported by hand buoys Walking forward with horizontal head turns x 4 widths; repeated with vertical head turns x 4 widths Kick board push pull in staggered le position 10; 12 and 2 o'clock positions 3x10-15. Cycling and ski holding to corner of deep end on yellow noodle.  Pt unable to gain balance sitting on noodle supported by hand buoys   Pt requires buoyancy for support and to offload joints with strengthening exercises. Viscosity of the water is needed for resistance of strengthening; water current perturbations provides challenge to standing balance unsupported, requiring increased core activation.  7/10  NuStep L5: 5 min for warm up with arms/legs Seated piriformis stretch 30s 2x Seated HS stretch 30s 2x STS from low table 10lbs 3x5 Paloff Press GTB 2x10  Sidestepping RTB at knees 3x length of bar Tandem Balance 30s 3x Seated flexion lumbar stretch 5s 10x with rolling stool      Previous:  Therapeutic exercise:  NuStep L5: 5 min for warm up with arms/legs Leg press 40# x 10, 55# x 10 Lat pull down 40# x 12 x 2 Row 40# x 10, 55# x 10 - with 3 sec pause in retraction Triceps press 55# x 12, x 10 Overhead press 10# x 8 reps (fatigues)  Chest press 40# x 10, 25# x 5 (improved tolerance) Seated hip abdct 40# x 20 55#  x10 Tandem stance without UE support x 15s each; walking to recover L stretch at sink, 10sec x 3 Seated hamstring 15s each Seated piriformis 15s each      PATIENT EDUCATION:  Education details: anatomy, exercise progression, DOMS expectations, muscle firing,  envelope of function, HEP, POC Person educated: Patient Education method: Explanation Education comprehension: verbalized understanding     HOME EXERCISE PROGRAM: Access Code: [email or web address] URL: [email or web address] Date: 09/15/2021 Prepared by: Daleen Bo  Exercises -  Standing Anti-Rotation Press with Anchored Resistance  - 1 x daily - 2 x weekly - 2 sets - 10 reps - Side Stepping with Resistance at Thighs  - 1 x daily - 2 x weekly - 3 sets - 10 reps - Sit to Stand  - 1 x daily - 2 x weekly - 3 sets - 10 reps - Standing Tandem Balance with Counter Support  - 1 x daily - 2 x weekly - 2 sets -  4 reps - 30 hold - Seated Figure 4 Piriformis Stretch  - 2 x daily - 7 x weekly - 1 sets - 3 reps - 30 hold - Seated Hamstring Stretch  - 2 x daily - 7 x weekly - 1 sets - 3 reps - 30 hold  ASSESSMENT:   CLINICAL IMPRESSION: Pt reports compliance with land HEP.  States it is challenging.  He is reminded to allow for recovery periods. Pt without discomfort throughout session today although does report stiffness.  He is challenged with SLS and head movement with dynamic positioning with balance requiring multiple recoveries from minor LOB (indep). Warrior I not tolerated due to shoulder discomfort. Ablation scheduled for tomorrow.  Goals ongoing   OBJECTIVE IMPAIRMENTS Abnormal gait, decreased activity tolerance, decreased balance, decreased endurance, decreased mobility, difficulty walking, decreased ROM, decreased strength, impaired flexibility, impaired sensation, and pain.    ACTIVITY LIMITATIONS lifting, bending, stairs, and reach over head   PARTICIPATION LIMITATIONS: cleaning, community activity, and yard work   PERSONAL FACTORS Age, Past/current experiences, Time since onset of injury/illness/exacerbation, and 3+ comorbidities: Left rotator cuff and bicep tendon tears, Cervical Spondylosis, DM, HTN, Lumbar fusion, chronic pain  are also affecting patient's functional outcome.    REHAB POTENTIAL: Good   CLINICAL DECISION MAKING: Unstable/unpredictable   EVALUATION COMPLEXITY: High     GOALS: Goals reviewed with patient? Yes   SHORT TERM GOALS: Target date: 08/27/2021   Pt will acquire cane; it will be properly adjusted to height with instruction on  coordinated use to improve toleration to amb/less back fatigue Baseline: Goal status: Achieved 08/25/21   2.  Increased amb distance without  back fatigue 1000 ft to and from aquatics Baseline: 577f Goal status: Achieved 08/25/21   3.  Decrease mid thoracic pain< 4/10 Baseline: 3-4/10 Goal status: Achieved 08/25/21     LONG TERM GOALS: Target date: 10/22/2021   BMerrilee Janskybalance test to improve to at least 35/56 ([facility] to demonstrate decreased fall risk Baseline: 28/56 Goal status: Achieved 09/03/21   2.  Foto to predicted 36 or greater Baseline: 28 Goal status:Achieved 08/20/21   3.  LE hip strength to 5/5 to demonstrate decreased pain in LB and improved strength Baseline: 4/5 limited to pain Goal status: INITIAL   4.  Pt will improve hamstring length to allow for indep and easy and safe donning and doffing of shoes and socks in seated position Baseline: with difficulty in standing position at eval; can don shoes in seated position Goal status: Partially Met    5.  Pt will report no falls since onset of skilled physical therapy Baseline: 2 last 5 months Goal status: ONGOING          PLAN: PT FREQUENCY: 1-2x/week   PT DURATION: 12 weeks   PLANNED INTERVENTIONS: Therapeutic exercises, Therapeutic activity, Neuromuscular re-education, Balance training, Gait training, Patient/Family education, Joint mobilization, Stair training, DME instructions, Aquatic Therapy, Electrical stimulation, and Taping.   PLAN FOR NEXT SESSION: land strengthening/ core; aquatic therapy for stretching LE and core, strengthening core, balance retraining.    MStanton Kidney(Frankie) Alfonse Garringer MPT 09/17/21 8:12 AM

## 2021-09-22 ENCOUNTER — Ambulatory Visit (HOSPITAL_BASED_OUTPATIENT_CLINIC_OR_DEPARTMENT_OTHER): Payer: Medicare Other | Admitting: Physical Therapy

## 2021-09-24 ENCOUNTER — Ambulatory Visit (HOSPITAL_BASED_OUTPATIENT_CLINIC_OR_DEPARTMENT_OTHER): Payer: Medicare Other | Admitting: Physical Therapy

## 2021-09-24 ENCOUNTER — Encounter (HOSPITAL_BASED_OUTPATIENT_CLINIC_OR_DEPARTMENT_OTHER): Payer: Self-pay | Admitting: Physical Therapy

## 2021-09-24 DIAGNOSIS — R2681 Unsteadiness on feet: Secondary | ICD-10-CM

## 2021-09-24 DIAGNOSIS — G894 Chronic pain syndrome: Secondary | ICD-10-CM

## 2021-09-24 DIAGNOSIS — R293 Abnormal posture: Secondary | ICD-10-CM

## 2021-09-24 NOTE — Therapy (Signed)
OUTPATIENT PHYSICAL THERAPY TREATMENT NOTE   Patient Name: Nicholas Morse MRN: [id number] DOB:Mar 11, 1955, 66 y.o., male Today's Date: 09/24/2021   PT End of Session - 09/24/21 0824     Visit Number 15    Number of Visits 24    Date for PT Re-Evaluation 10/22/21    Authorization Type medicare    Authorization - Visit Number --    Progress Note Due on Visit 20    PT Start Time 0815    PT Stop Time 0855    PT Time Calculation (min) 40 min    Activity Tolerance Patient tolerated treatment well    Behavior During Therapy WFL for tasks assessed/performed               Past Medical History:  Diagnosis Date   Anxiety    Arthritis    DJD (degenerative joint disease)    Hx of seasonal allergies    PMH   Hypothyroidism    Insomnia    Nocturia    Numbness and tingling    B/LLE   Panic attacks    PONV (postoperative nausea and vomiting)    Past Surgical History:  Procedure Laterality Date   BACK SURGERY     CARPAL TUNNEL RELEASE     B/L   COLONOSCOPY     FRACTURE SURGERY     Right knee   KNEE SURGERY     LCL rupture   MAXIMUM ACCESS (MAS)POSTERIOR LUMBAR INTERBODY FUSION (PLIF) 2 LEVEL N/A 11/21/2013   Procedure: FOR MAXIMUM ACCESS (MAS) POSTERIOR LUMBAR INTERBODY FUSION LUMBAR FOUR-FIVE, LUMBAR FIVE-SACRAL ONE WITH REDO DECOMPRESSION;  Surgeon: Erline Levine, MD;  Location: [place] [facility];  Service: Neurosurgery;  Laterality: N/A;   MUSCLE REPAIR     Bicept and Tricept rupture repair surgery   Patient Active Problem List   Diagnosis Date Noted   Spondylolisthesis of lumbar region 11/21/2013     PCP: Neila Gear, MD   REFERRING PROVIDER: Simona Huh, NP   REFERRING DIAG: G89.4 (ICD-10-CM) - Chronic pain syndrome   Rationale for Evaluation and Treatment Rehabilitation   THERAPY DIAG:  Chronic pain syndrome   Unsteadiness on feet   Abnormal posture   ONSET DATE: >10 yrs   SUBJECTIVE:                                                                                                                                                                                             SUBJECTIVE STATEMENT: Pt states he had Ablation last Thursday for L2-4.  He reports he had pain for 2 days following that (iced and rested), but by  3rd day his pain was significantly reduced.     PERTINENT HISTORY:  L shoulder rotator cuff tear with bicep tendon rupture without repair >+5 yrs ago. ROM and strength limited Spinal stimulator DM HTN   PAIN:  Are you having pain? Yes NPRS scale: 2/10 Pain location: lower thoracic spine/lumbar Pain description:  tight Aggravating factors: bending, twisting, sneezing Relieving factors: stimulator, stretches     PRECAUTIONS: Fall   WEIGHT BEARING RESTRICTIONS No   FALLS:  Has patient fallen in last 6 months? Yes. Number of falls 2   LIVING ENVIRONMENT: Lives with: lives with their family Lives in: House/apartment Stairs: Yes: Internal: 18 steps; can reach both Has following equipment at home: None   OCCUPATION:      PLOF: Independent   PATIENT GOALS Increase flexability     OBJECTIVE:  * Findings taken at [facility] unless otherwise noted.   DIAGNOSTIC FINDINGS:  MRI cervical spine: Cervical Spondylosis 1. 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 2. Mildly worsened moderate right C6-7 neural foraminal stenosis. 3. Unchanged moderate bilateral C3-4 and left C5-6 neural foraminal stenosis. 4. No spinal canal stenosis.   PATIENT SURVEYS:  FOTO 28 with predicted 8 point improvement to 36 08/20/21 45   SCREENING FOR RED FLAGS: Neg for all   COGNITION:            Overall cognitive status: Within functional limits for tasks assessed                          SENSATION: Peripheral neuropathies: bilat feet decreased sensation to light touch; hot/cold   MUSCLE LENGTH: Hamstrings shortened.  Unable to test due to not tolerating   POSTURE: rounded shoulders, forward head, and decreased lumbar  lordosis   PALPATION: Minimally tender lumbar and low thoracic paraspinals upper sacrum   LUMBAR ROM:  Limited 80% in all directions Pain with movement > on LvsR     LOWER EXTREMITY ROM:                WFL   LOWER EXTREMITY MMT:     MMT Right eval Left eval Right  09/10/21 Left 09/10/21  Hip flexion 4 Seated 4 seated 5/5 seated 4+/5 seated  Hip extension        Hip abduction 5 5    Hip adduction 5 5    Hip internal rotation        Hip external rotation        Knee flexion 5 5    Knee extension 5 5    Ankle dorsiflexion 5 5    Ankle plantarflexion        Ankle inversion        Ankle eversion         (Blank rows = not tested)   LUMBAR SPECIAL TESTS:  Slump test: Negative   FUNCTIONAL TESTS:  5 times sit to stand: 29s Timed up and go (TUG): 20 Berg Balance Scale: 28/56  Berg Balance                                                              07/30/21   09/03/21 Sit to stand 2 4  Standing unsupported 4 4  Sitting with back unsupported but feet supported 4  4  Stand to sit  2 4  Transfers  3 4  Standing unsupported with eyes closed 3 4  Standing unsupported feet together 3 4  From standing position, reach forward with outstretched arm 2 3  From standing position, pick up object from floor 1 3  From standing position, turn and look behind over each shoulder 2 2  Turn 360 1 2  Standing unsupported, alternately place foot on step 1 4  Standing unsupported, one foot in front 0 3  Standing on one leg 0 1  Total:  28 46      TODAY'S TREATMENT  7/19: Pt seen for aquatic therapy today.  Treatment took place in water 3.25-4.5 ft in depth at the [place]. Temp of water was 91.  Pt entered/exited the pool via stairs independently with single rail.  * Walking forward, back multiple widths * Walking forward/backward lunges with rainbow buoys at side * SLS with forward flutter with rainbow buoys on surface; SLS with bilat shoulder abdct/ add with rainbow  buoys * wide stance with alternating UE yellow buoy pull downs in shoulder flexion; repeated in shoulder abdct * tandem gait forward/ backward holding yellow buoys; repeated without support  * straddling yellow noodle - forward/ backward "stool scoots" and light bouncing; cycling holding corner to no UE support * Kick board push pull in staggered stance position 11, 12 and 1 o'clock positions  * wood chop with yellow hand buoy x 5 each side * holding wall: fig 4 stretch  * Ai Chi: soothing (with foot swivels) * hamstring stretch with foot on 2nd step * L stretch  Pt requires the buoyancy and hydrostatic pressure of water for support, and to offload joints by unweighting joint load by at least 50 % in navel deep water and by at least 75-80% in chest to neck deep water.  Viscosity of the water is needed for resistance of strengthening. Water current perturbations provides challenge to standing balance requiring increased core activation.  Previous aquatic 7/12: Pt seen for aquatic therapy today.  Water 3.25-4.8 ft in depth . Temp 91.  Pt entered/exited the pool via stairs step through pattern independently   Walking forward, back multiple widths LB stretching at steps with bilat rail ue supported by hand railss 3-4 x20s; Also completed between exercises. STS from 4th step (bottom) 11.5 lb buoyancy ball 3x5 STS 3rd step with adductor set squeezing BB 2x5.  Required extra buoyancy to complete Tandem stance ue supported on hand buoys progressed to unsupported SLS ue supported by hand buoys Walking forward with horizontal head turns x 4 widths; repeated with vertical head turns x 4 widths Kick board push pull in staggered le position 10; 12 and 2 o'clock positions 3x10-15. Cycling and ski holding to corner of deep end on yellow noodle.  Pt unable to gain balance sitting on noodle supported by hand buoys  Previous land  7/10:  NuStep L5: 5 min for warm up with arms/legs Seated piriformis  stretch 30s 2x Seated HS stretch 30s 2x STS from low table 10lbs 3x5 Paloff Press GTB 2x10  Sidestepping RTB at knees 3x length of bar Tandem Balance 30s 3x Seated flexion lumbar stretch 5s 10x with rolling stool     PATIENT EDUCATION:  Education details: anatomy, exercise progression, DOMS expectations, muscle firing,  envelope of function, HEP, POC Person educated: Patient Education method: Explanation Education comprehension: verbalized understanding     HOME EXERCISE PROGRAM: Access Code: [email or web address] URL: [email or web address] Date:  09/15/2021 Prepared by: Daleen Bo  Exercises - Standing Anti-Rotation Press with Anchored Resistance  - 1 x daily - 2 x weekly - 2 sets - 10 reps - Side Stepping with Resistance at Thighs  - 1 x daily - 2 x weekly - 3 sets - 10 reps - Sit to Stand  - 1 x daily - 2 x weekly - 3 sets - 10 reps - Standing Tandem Balance with Counter Support  - 1 x daily - 2 x weekly - 2 sets - 4 reps - 30 hold - Seated Figure 4 Piriformis Stretch  - 2 x daily - 7 x weekly - 1 sets - 3 reps - 30 hold - Seated Hamstring Stretch  - 2 x daily - 7 x weekly - 1 sets - 3 reps - 30 hold  ASSESSMENT:   CLINICAL IMPRESSION: Pt challenged with SLS exercises with dynamic UE motions.  He reported some cramping in his LE during lunges and tandem gait; he attributes this to dehydration.  He did not require intermittent low back stretches during session as he did prior to ablation.  Encouraged pt to not overdo it now that he has less pain in back.  Progressing well towards goals.    OBJECTIVE IMPAIRMENTS Abnormal gait, decreased activity tolerance, decreased balance, decreased endurance, decreased mobility, difficulty walking, decreased ROM, decreased strength, impaired flexibility, impaired sensation, and pain.    ACTIVITY LIMITATIONS lifting, bending, stairs, and reach over head   PARTICIPATION LIMITATIONS: cleaning, community activity, and yard work   PERSONAL  FACTORS Age, Past/current experiences, Time since onset of injury/illness/exacerbation, and 3+ comorbidities: Left rotator cuff and bicep tendon tears, Cervical Spondylosis, DM, HTN, Lumbar fusion, chronic pain  are also affecting patient's functional outcome.    REHAB POTENTIAL: Good   CLINICAL DECISION MAKING: Unstable/unpredictable   EVALUATION COMPLEXITY: High     GOALS: Goals reviewed with patient? Yes   SHORT TERM GOALS: Target date: 08/27/2021   Pt will acquire cane; it will be properly adjusted to height with instruction on coordinated use to improve toleration to amb/less back fatigue Baseline: Goal status: Achieved 08/25/21   2.  Increased amb distance without  back fatigue 1000 ft to and from aquatics Baseline: 529f Goal status: Achieved 08/25/21   3.  Decrease mid thoracic pain< 4/10 Baseline: 3-4/10 Goal status: Achieved 08/25/21     LONG TERM GOALS: Target date: 10/22/2021   BMerrilee Janskybalance test to improve to at least 35/56 ([facility] to demonstrate decreased fall risk Baseline: 28/56 Goal status: Achieved 09/03/21   2.  Foto to predicted 36 or greater Baseline: 28 Goal status:Achieved 08/20/21   3.  LE hip strength to 5/5 to demonstrate decreased pain in LB and improved strength Baseline: 4/5 limited to pain Goal status: INITIAL   4.  Pt will improve hamstring length to allow for indep and easy and safe donning and doffing of shoes and socks in seated position Baseline: with difficulty in standing position at eval; can don shoes in seated position Goal status: Partially Met    5.  Pt will report no falls since onset of skilled physical therapy Baseline: 2 last 5 months Goal status: ONGOING          PLAN: PT FREQUENCY: 1-2x/week   PT DURATION: 12 weeks   PLANNED INTERVENTIONS: Therapeutic exercises, Therapeutic activity, Neuromuscular re-education, Balance training, Gait training, Patient/Family education, Joint mobilization, Stair training, DME instructions,  Aquatic Therapy, Electrical stimulation, and Taping.   PLAN FOR NEXT SESSION: land  strengthening/ core; aquatic therapy for stretching LE and core, strengthening core, balance retraining.     Kerin Perna, PTA 09/24/21 9:02 AM

## 2021-09-26 ENCOUNTER — Encounter (HOSPITAL_BASED_OUTPATIENT_CLINIC_OR_DEPARTMENT_OTHER): Payer: Self-pay | Admitting: Physical Therapy

## 2021-09-26 ENCOUNTER — Ambulatory Visit (HOSPITAL_BASED_OUTPATIENT_CLINIC_OR_DEPARTMENT_OTHER): Payer: Medicare Other | Admitting: Physical Therapy

## 2021-09-26 DIAGNOSIS — G894 Chronic pain syndrome: Secondary | ICD-10-CM

## 2021-09-26 DIAGNOSIS — R2681 Unsteadiness on feet: Secondary | ICD-10-CM

## 2021-09-26 DIAGNOSIS — R293 Abnormal posture: Secondary | ICD-10-CM

## 2021-09-26 NOTE — Therapy (Signed)
OUTPATIENT PHYSICAL THERAPY TREATMENT NOTE   Patient Name: Nicholas Morse MRN: [id number] DOB:1955-05-30, 66 y.o., male Today's Date: 09/26/2021   PT End of Session - 09/26/21 0804     Visit Number 16    Number of Visits 24    Date for PT Re-Evaluation 10/22/21    Authorization Type medicare    Progress Note Due on Visit 20    PT Start Time 0802    PT Stop Time 0840    PT Time Calculation (min) 38 min    Activity Tolerance Patient tolerated treatment well    Behavior During Therapy WFL for tasks assessed/performed               Past Medical History:  Diagnosis Date   Anxiety    Arthritis    DJD (degenerative joint disease)    Hx of seasonal allergies    PMH   Hypothyroidism    Insomnia    Nocturia    Numbness and tingling    B/LLE   Panic attacks    PONV (postoperative nausea and vomiting)    Past Surgical History:  Procedure Laterality Date   BACK SURGERY     CARPAL TUNNEL RELEASE     B/L   COLONOSCOPY     FRACTURE SURGERY     Right knee   KNEE SURGERY     LCL rupture   MAXIMUM ACCESS (MAS)POSTERIOR LUMBAR INTERBODY FUSION (PLIF) 2 LEVEL N/A 11/21/2013   Procedure: FOR MAXIMUM ACCESS (MAS) POSTERIOR LUMBAR INTERBODY FUSION LUMBAR FOUR-FIVE, LUMBAR FIVE-SACRAL ONE WITH REDO DECOMPRESSION;  Surgeon: Erline Levine, MD;  Location: [place] [facility];  Service: Neurosurgery;  Laterality: N/A;   MUSCLE REPAIR     Bicept and Tricept rupture repair surgery   Patient Active Problem List   Diagnosis Date Noted   Spondylolisthesis of lumbar region 11/21/2013     PCP: Neila Gear, MD   REFERRING PROVIDER: Simona Huh, NP   REFERRING DIAG: G89.4 (ICD-10-CM) - Chronic pain syndrome   Rationale for Evaluation and Treatment Rehabilitation   THERAPY DIAG:  Chronic pain syndrome   Unsteadiness on feet   Abnormal posture   ONSET DATE: >10 yrs   SUBJECTIVE:                                                                                                                                                                                             SUBJECTIVE STATEMENT: Pt states the ablation is helping. He feels good today. Unfortunately, his mother broke her hip and he has had to sit a little more recently. Pt states his back feels weak.  PERTINENT HISTORY:  L shoulder rotator cuff tear with bicep tendon rupture without repair >+5 yrs ago. ROM and strength limited Spinal stimulator DM HTN   PAIN:  Are you having pain? No NPRS scale: 0/10 Pain location: lower thoracic spine/lumbar Pain description:  tight Aggravating factors: bending, twisting, sneezing Relieving factors: stimulator, stretches     PRECAUTIONS: Fall   WEIGHT BEARING RESTRICTIONS No   FALLS:  Has patient fallen in last 6 months? Yes. Number of falls 2   LIVING ENVIRONMENT: Lives with: lives with their family Lives in: House/apartment Stairs: Yes: Internal: 18 steps; can reach both Has following equipment at home: None   OCCUPATION:      PLOF: Independent   PATIENT GOALS Increase flexability     OBJECTIVE:  * Findings taken at [facility] unless otherwise noted.   DIAGNOSTIC FINDINGS:  MRI cervical spine: Cervical Spondylosis 1. 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 2. Mildly worsened moderate right C6-7 neural foraminal stenosis. 3. Unchanged moderate bilateral C3-4 and left C5-6 neural foraminal stenosis. 4. No spinal canal stenosis.   PATIENT SURVEYS:  FOTO 28 with predicted 8 point improvement to 36 08/20/21 45   SCREENING FOR RED FLAGS: Neg for all   COGNITION:            Overall cognitive status: Within functional limits for tasks assessed                          SENSATION: Peripheral neuropathies: bilat feet decreased sensation to light touch; hot/cold   MUSCLE LENGTH: Hamstrings shortened.  Unable to test due to not tolerating   POSTURE: rounded shoulders, forward head, and decreased lumbar lordosis   PALPATION: Minimally tender  lumbar and low thoracic paraspinals upper sacrum   LUMBAR ROM:  Limited 80% in all directions Pain with movement > on LvsR     LOWER EXTREMITY ROM:                WFL   LOWER EXTREMITY MMT:     MMT Right eval Left eval Right  09/10/21 Left 09/10/21  Hip flexion 4 Seated 4 seated 5/5 seated 4+/5 seated  Hip extension        Hip abduction 5 5    Hip adduction 5 5    Hip internal rotation        Hip external rotation        Knee flexion 5 5    Knee extension 5 5    Ankle dorsiflexion 5 5    Ankle plantarflexion        Ankle inversion        Ankle eversion         (Blank rows = not tested)   LUMBAR SPECIAL TESTS:  Slump test: Negative   FUNCTIONAL TESTS:  5 times sit to stand: 29s Timed up and go (TUG): 20 Berg Balance Scale: 28/56  Berg Balance                                                              07/30/21   09/03/21 Sit to stand 2 4  Standing unsupported 4 4  Sitting with back unsupported but feet supported 4 4  Stand to sit  2 4  Transfers  3 4  Standing unsupported with eyes closed 3 4  Standing unsupported feet together 3 4  From standing position, reach forward with outstretched arm 2 3  From standing position, pick up object from floor 1 3  From standing position, turn and look behind over each shoulder 2 2  Turn 360 1 2  Standing unsupported, alternately place foot on step 1 4  Standing unsupported, one foot in front 0 3  Standing on one leg 0 1  Total:  28 46      TODAY'S TREATMENT   7/21 NuStep L6: 7 min for warm up with arms/legs  STS from low table 10lbs 3x5 Paloff Press cable 10lbs  2x10  Shuttle leg press 125lbs 2x10 (staggered stance, alternating) Tandem Balance 30s 3x 6" box step up 5lb cable chop 2x10 NBOS with head turns 30s 3x 80 BPM Tandem stance on foam 30s 2x     7/19: Pt seen for aquatic therapy today.  Treatment took place in water 3.25-4.5 ft in depth at the [place]. Temp of water was 91.  Pt  entered/exited the pool via stairs independently with single rail.  * Walking forward, back multiple widths * Walking forward/backward lunges with rainbow buoys at side * SLS with forward flutter with rainbow buoys on surface; SLS with bilat shoulder abdct/ add with rainbow buoys * wide stance with alternating UE yellow buoy pull downs in shoulder flexion; repeated in shoulder abdct * tandem gait forward/ backward holding yellow buoys; repeated without support  * straddling yellow noodle - forward/ backward "stool scoots" and light bouncing; cycling holding corner to no UE support * Kick board push pull in staggered stance position 11, 12 and 1 o'clock positions  * wood chop with yellow hand buoy x 5 each side * holding wall: fig 4 stretch  * Ai Chi: soothing (with foot swivels) * hamstring stretch with foot on 2nd step * L stretch  Pt requires the buoyancy and hydrostatic pressure of water for support, and to offload joints by unweighting joint load by at least 50 % in navel deep water and by at least 75-80% in chest to neck deep water.  Viscosity of the water is needed for resistance of strengthening. Water current perturbations provides challenge to standing balance requiring increased core activation.  Previous aquatic 7/12: Pt seen for aquatic therapy today.  Water 3.25-4.8 ft in depth . Temp 91.  Pt entered/exited the pool via stairs step through pattern independently   Walking forward, back multiple widths LB stretching at steps with bilat rail ue supported by hand railss 3-4 x20s; Also completed between exercises. STS from 4th step (bottom) 11.5 lb buoyancy ball 3x5 STS 3rd step with adductor set squeezing BB 2x5.  Required extra buoyancy to complete Tandem stance ue supported on hand buoys progressed to unsupported SLS ue supported by hand buoys Walking forward with horizontal head turns x 4 widths; repeated with vertical head turns x 4 widths Kick board push pull in staggered le  position 10; 12 and 2 o'clock positions 3x10-15. Cycling and ski holding to corner of deep end on yellow noodle.  Pt unable to gain balance sitting on noodle supported by hand buoys  Previous land  7/10:  NuStep L5: 5 min for warm up with arms/legs Seated piriformis stretch 30s 2x Seated HS stretch 30s 2x STS from low table 10lbs 3x5 Paloff Press GTB 2x10  Sidestepping RTB at knees 3x length of bar Tandem Balance 30s 3x Seated flexion  lumbar stretch 5s 10x with rolling stool     PATIENT EDUCATION:  Education details: anatomy, exercise progression, DOMS expectations, muscle firing,  envelope of function, HEP, POC Person educated: Patient Education method: Explanation Education comprehension: verbalized understanding     HOME EXERCISE PROGRAM: Access Code: [email or web address] URL: [email or web address] Date: 09/15/2021 Prepared by: Daleen Bo  Exercises - Standing Anti-Rotation Press with Anchored Resistance  - 1 x daily - 2 x weekly - 2 sets - 10 reps - Side Stepping with Resistance at Thighs  - 1 x daily - 2 x weekly - 3 sets - 10 reps - Sit to Stand  - 1 x daily - 2 x weekly - 3 sets - 10 reps - Standing Tandem Balance with Counter Support  - 1 x daily - 2 x weekly - 2 sets - 4 reps - 30 hold - Seated Figure 4 Piriformis Stretch  - 2 x daily - 7 x weekly - 1 sets - 3 reps - 30 hold - Seated Hamstring Stretch  - 2 x daily - 7 x weekly - 1 sets - 3 reps - 30 hold  ASSESSMENT:   CLINICAL IMPRESSION: Pt able to continue with progression  of land based strength and balance exercise today without pain. However, pt does require increased seated rest breaks with increased intensity and volume due ot general muscle fatigue and deconditioning. Pt does have issue with dynamic balance and SL stability once there is SOB and muscle fatigue. Pt does appear to have R LE weaker than R with CKC tasks. Plan to continue with strength and stability as pt's pain is now well controlled following  ablation. Consider circuits for more muscle endurance and funcitonal capacity. Pt would benefit from continued skilled therapy in order to reach goals and maximize functional lumbopelvic strength and stability for prevention of further functional decline.   OBJECTIVE IMPAIRMENTS Abnormal gait, decreased activity tolerance, decreased balance, decreased endurance, decreased mobility, difficulty walking, decreased ROM, decreased strength, impaired flexibility, impaired sensation, and pain.    ACTIVITY LIMITATIONS lifting, bending, stairs, and reach over head   PARTICIPATION LIMITATIONS: cleaning, community activity, and yard work   PERSONAL FACTORS Age, Past/current experiences, Time since onset of injury/illness/exacerbation, and 3+ comorbidities: Left rotator cuff and bicep tendon tears, Cervical Spondylosis, DM, HTN, Lumbar fusion, chronic pain  are also affecting patient's functional outcome.    REHAB POTENTIAL: Good   CLINICAL DECISION MAKING: Unstable/unpredictable   EVALUATION COMPLEXITY: High     GOALS: Goals reviewed with patient? Yes   SHORT TERM GOALS: Target date: 08/27/2021   Pt will acquire cane; it will be properly adjusted to height with instruction on coordinated use to improve toleration to amb/less back fatigue Baseline: Goal status: Achieved 08/25/21   2.  Increased amb distance without  back fatigue 1000 ft to and from aquatics Baseline: 524f Goal status: Achieved 08/25/21   3.  Decrease mid thoracic pain< 4/10 Baseline: 3-4/10 Goal status: Achieved 08/25/21     LONG TERM GOALS: Target date: 10/22/2021   BMerrilee Janskybalance test to improve to at least 35/56 ([facility] to demonstrate decreased fall risk Baseline: 28/56 Goal status: Achieved 09/03/21   2.  Foto to predicted 36 or greater Baseline: 28 Goal status:Achieved 08/20/21   3.  LE hip strength to 5/5 to demonstrate decreased pain in LB and improved strength Baseline: 4/5 limited to pain Goal status: INITIAL    4.  Pt will improve hamstring length to allow for indep and easy and safe donning and  doffing of shoes and socks in seated position Baseline: with difficulty in standing position at eval; can don shoes in seated position Goal status: Partially Met    5.  Pt will report no falls since onset of skilled physical therapy Baseline: 2 last 5 months Goal status: ONGOING          PLAN: PT FREQUENCY: 1-2x/week   PT DURATION: 12 weeks   PLANNED INTERVENTIONS: Therapeutic exercises, Therapeutic activity, Neuromuscular re-education, Balance training, Gait training, Patient/Family education, Joint mobilization, Stair training, DME instructions, Aquatic Therapy, Electrical stimulation, and Taping.   PLAN FOR NEXT SESSION: land strengthening/ core; aquatic therapy for stretching LE and core, strengthening core, balance retraining.    Daleen Bo PT, DPT 09/26/21 8:41 AM

## 2021-09-29 ENCOUNTER — Encounter (HOSPITAL_BASED_OUTPATIENT_CLINIC_OR_DEPARTMENT_OTHER): Payer: Self-pay | Admitting: Physical Therapy

## 2021-09-29 ENCOUNTER — Ambulatory Visit (HOSPITAL_BASED_OUTPATIENT_CLINIC_OR_DEPARTMENT_OTHER): Payer: Medicare Other | Admitting: Physical Therapy

## 2021-09-29 DIAGNOSIS — G894 Chronic pain syndrome: Secondary | ICD-10-CM | POA: Diagnosis not present

## 2021-09-29 DIAGNOSIS — R2681 Unsteadiness on feet: Secondary | ICD-10-CM

## 2021-09-29 DIAGNOSIS — R293 Abnormal posture: Secondary | ICD-10-CM

## 2021-09-29 NOTE — Therapy (Signed)
OUTPATIENT PHYSICAL THERAPY TREATMENT NOTE   Patient Name: Cari Vandeberg MRN: [id number] DOB:[birth year], 66 y.o., male Today's Date: 09/29/2021   PT End of Session - 09/29/21 0817     Visit Number 17    Number of Visits 24    Date for PT Re-Evaluation 10/22/21    Authorization Type medicare    PT Start Time [phone]    PT Stop Time 0900    PT Time Calculation (min) 43 min    Activity Tolerance Patient tolerated treatment well    Behavior During Therapy WFL for tasks assessed/performed               Past Medical History:  Diagnosis Date   Anxiety    Arthritis    DJD (degenerative joint disease)    Hx of seasonal allergies    PMH   Hypothyroidism    Insomnia    Nocturia    Numbness and tingling    B/LLE   Panic attacks    PONV (postoperative nausea and vomiting)    Past Surgical History:  Procedure Laterality Date   BACK SURGERY     CARPAL TUNNEL RELEASE     B/L   COLONOSCOPY     FRACTURE SURGERY     Right knee   KNEE SURGERY     LCL rupture   MAXIMUM ACCESS (MAS)POSTERIOR LUMBAR INTERBODY FUSION (PLIF) 2 LEVEL N/A 11/21/2013   Procedure: FOR MAXIMUM ACCESS (MAS) POSTERIOR LUMBAR INTERBODY FUSION LUMBAR FOUR-FIVE, LUMBAR FIVE-SACRAL ONE WITH REDO DECOMPRESSION;  Surgeon: Erline Levine, MD;  Location: [place] [facility];  Service: Neurosurgery;  Laterality: N/A;   MUSCLE REPAIR     Bicept and Tricept rupture repair surgery   Patient Active Problem List   Diagnosis Date Noted   Spondylolisthesis of lumbar region 11/21/2013     PCP: Neila Gear, MD   REFERRING PROVIDER: Simona Huh, NP   REFERRING DIAG: G89.4 (ICD-10-CM) - Chronic pain syndrome   Rationale for Evaluation and Treatment Rehabilitation   THERAPY DIAG:  Chronic pain syndrome   Unsteadiness on feet   Abnormal posture   ONSET DATE: >10 yrs   SUBJECTIVE:                                                                                                                                                                                             SUBJECTIVE STATEMENT: Pt states very little pain, fatigue from caring for mother   PERTINENT HISTORY:  L shoulder rotator cuff tear with bicep tendon rupture without repair >+5 yrs ago. ROM and strength limited Spinal stimulator DM HTN   PAIN:  Are  you having pain? No NPRS scale: 2/10 Pain location: lower thoracic spine/lumbar Pain description:  tight Aggravating factors: bending, twisting, sneezing Relieving factors: stimulator, stretches     PRECAUTIONS: Fall   WEIGHT BEARING RESTRICTIONS No   FALLS:  Has patient fallen in last 6 months? Yes. Number of falls 2   LIVING ENVIRONMENT: Lives with: lives with their family Lives in: House/apartment Stairs: Yes: Internal: 18 steps; can reach both Has following equipment at home: None   OCCUPATION:      PLOF: Independent   PATIENT GOALS Increase flexability     OBJECTIVE:  * Findings taken at [facility] unless otherwise noted.   DIAGNOSTIC FINDINGS:  MRI cervical spine: Cervical Spondylosis 1. 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 2. Mildly worsened moderate right C6-7 neural foraminal stenosis. 3. Unchanged moderate bilateral C3-4 and left C5-6 neural foraminal stenosis. 4. No spinal canal stenosis.   PATIENT SURVEYS:  FOTO 28 with predicted 8 point improvement to 36 08/20/21 45   SCREENING FOR RED FLAGS: Neg for all   COGNITION:            Overall cognitive status: Within functional limits for tasks assessed                          SENSATION: Peripheral neuropathies: bilat feet decreased sensation to light touch; hot/cold   MUSCLE LENGTH: Hamstrings shortened.  Unable to test due to not tolerating   POSTURE: rounded shoulders, forward head, and decreased lumbar lordosis   PALPATION: Minimally tender lumbar and low thoracic paraspinals upper sacrum   LUMBAR ROM:  Limited 80% in all directions Pain with movement > on LvsR     LOWER EXTREMITY  ROM:                WFL   LOWER EXTREMITY MMT:     MMT Right eval Left eval Right  09/10/21 Left 09/10/21  Hip flexion 4 Seated 4 seated 5/5 seated 4+/5 seated  Hip extension        Hip abduction 5 5    Hip adduction 5 5    Hip internal rotation        Hip external rotation        Knee flexion 5 5    Knee extension 5 5    Ankle dorsiflexion 5 5    Ankle plantarflexion        Ankle inversion        Ankle eversion         (Blank rows = not tested)   LUMBAR SPECIAL TESTS:  Slump test: Negative   FUNCTIONAL TESTS:  5 times sit to stand: 29s Timed up and go (TUG): 20 Berg Balance Scale: 28/56  Berg Balance                                                              07/30/21   09/03/21 Sit to stand 2 4  Standing unsupported 4 4  Sitting with back unsupported but feet supported 4 4  Stand to sit  2 4  Transfers  3 4  Standing unsupported with eyes closed 3 4  Standing unsupported feet together 3 4  From standing position, reach forward with outstretched arm 2 3  From  standing position, pick up object from floor 1 3  From standing position, turn and look behind over each shoulder 2 2  Turn 360 1 2  Standing unsupported, alternately place foot on step 1 4  Standing unsupported, one foot in front 0 3  Standing on one leg 0 1  Total:  28 46      TODAY'S TREATMENT   7/24 Pt seen for aquatic therapy today.  Treatment took place in water 3.25-4.5 ft in depth at the [place]. Temp of water was 91.  Pt entered/exited the pool via stairs independently with single rail.  * Walking forward, back multiple widths * hamstring /gastroc stretch with foot on 3rd step 3x30 *Figure 4 seated R/L 3x30s  *IT band band stretch leaning against wall 2 x 30s *STS with add set squeezing BB x10 from 4th step (bottom) x 10 *STS from 4th step (bottom) 11.5 lb buoyancy ball 2x5 * tandem gait forward/ backward holding yellow buoys; repeated without support  * Walking  forward/backward/side lunges with rainbow buoys at side * SLS  with rainbow buoys on surface x 20s x2, without ue support L x20, R x 8s; SLS with bilat shoulder abdct/ add with rainbow buoys, pt fatiguing increases instability * wide stance with alternating UE yellow buoy pull downs in shoulder flexion; repeated in shoulder abdct  Pt requires the buoyancy and hydrostatic pressure of water for support, and to offload joints by unweighting joint load by at least 50 % in navel deep water and by at least 75-80% in chest to neck deep water.  Viscosity of the water is needed for resistance of strengthening. Water current perturbations provides challenge to standing balance requiring increased core activation.   7/21 NuStep L6: 7 min for warm up with arms/legs  STS from low table 10lbs 3x5 Paloff Press cable 10lbs  2x10  Shuttle leg press 125lbs 2x10 (staggered stance, alternating) Tandem Balance 30s 3x 6" box step up 5lb cable chop 2x10 NBOS with head turns 30s 3x 80 BPM Tandem stance on foam 30s 2x          PATIENT EDUCATION:  Education details: anatomy, exercise progression, DOMS expectations, muscle firing,  envelope of function, HEP, POC Person educated: Patient Education method: Explanation Education comprehension: verbalized understanding     HOME EXERCISE PROGRAM: Access Code: [email or web address] URL: [email or web address] Date: 09/15/2021 Prepared by: Daleen Bo  Exercises - Standing Anti-Rotation Press with Anchored Resistance  - 1 x daily - 2 x weekly - 2 sets - 10 reps - Side Stepping with Resistance at Thighs  - 1 x daily - 2 x weekly - 3 sets - 10 reps - Sit to Stand  - 1 x daily - 2 x weekly - 3 sets - 10 reps - Standing Tandem Balance with Counter Support  - 1 x daily - 2 x weekly - 2 sets - 4 reps - 30 hold - Seated Figure 4 Piriformis Stretch  - 2 x daily - 7 x weekly - 1 sets - 3 reps - 30 hold - Seated Hamstring Stretch  - 2 x daily - 7 x weekly - 1 sets - 3  reps - 30 hold  ASSESSMENT:   CLINICAL IMPRESSION: Some discomfort today, reports of bad w/e. Progressed water based program to align with land program advancing balance, core strength and aerobic capacity. 2 recovery periods needed due to fatigue.  Balance ability decreases with increased fatigue. >difficulty with SLS R vs L. Pt progressing well with  current plan. Goals ongoing.      OBJECTIVE IMPAIRMENTS Abnormal gait, decreased activity tolerance, decreased balance, decreased endurance, decreased mobility, difficulty walking, decreased ROM, decreased strength, impaired flexibility, impaired sensation, and pain.    ACTIVITY LIMITATIONS lifting, bending, stairs, and reach over head   PARTICIPATION LIMITATIONS: cleaning, community activity, and yard work   PERSONAL FACTORS Age, Past/current experiences, Time since onset of injury/illness/exacerbation, and 3+ comorbidities: Left rotator cuff and bicep tendon tears, Cervical Spondylosis, DM, HTN, Lumbar fusion, chronic pain  are also affecting patient's functional outcome.    REHAB POTENTIAL: Good   CLINICAL DECISION MAKING: Unstable/unpredictable   EVALUATION COMPLEXITY: High     GOALS: Goals reviewed with patient? Yes   SHORT TERM GOALS: Target date: 08/27/2021   Pt will acquire cane; it will be properly adjusted to height with instruction on coordinated use to improve toleration to amb/less back fatigue Baseline: Goal status: Achieved 08/25/21   2.  Increased amb distance without  back fatigue 1000 ft to and from aquatics Baseline: 569f Goal status: Achieved 08/25/21   3.  Decrease mid thoracic pain< 4/10 Baseline: 3-4/10 Goal status: Achieved 08/25/21     LONG TERM GOALS: Target date: 10/22/2021   BMerrilee Janskybalance test to improve to at least 35/56 ([facility] to demonstrate decreased fall risk Baseline: 28/56 Goal status: Achieved 09/03/21   2.  Foto to predicted 36 or greater Baseline: 28 Goal status:Achieved 08/20/21   3.  LE  hip strength to 5/5 to demonstrate decreased pain in LB and improved strength Baseline: 4/5 limited to pain Goal status: INITIAL   4.  Pt will improve hamstring length to allow for indep and easy and safe donning and doffing of shoes and socks in seated position Baseline: with difficulty in standing position at eval; can don shoes in seated position Goal status: Partially Met    5.  Pt will report no falls since onset of skilled physical therapy Baseline: 2 last 5 months Goal status: ONGOING          PLAN: PT FREQUENCY: 1-2x/week   PT DURATION: 12 weeks   PLANNED INTERVENTIONS: Therapeutic exercises, Therapeutic activity, Neuromuscular re-education, Balance training, Gait training, Patient/Family education, Joint mobilization, Stair training, DME instructions, Aquatic Therapy, Electrical stimulation, and Taping.   PLAN FOR NEXT SESSION: land strengthening/ core; aquatic therapy for stretching LE and core, strengthening core, balance retraining.    MStanton Kidney(Frankie) Christabelle Hanzlik MPT 09/29/21 10:02 AM

## 2021-10-02 ENCOUNTER — Ambulatory Visit (HOSPITAL_BASED_OUTPATIENT_CLINIC_OR_DEPARTMENT_OTHER): Payer: Medicare Other | Admitting: Physical Therapy

## 2021-10-02 ENCOUNTER — Encounter (HOSPITAL_BASED_OUTPATIENT_CLINIC_OR_DEPARTMENT_OTHER): Payer: Self-pay | Admitting: Physical Therapy

## 2021-10-02 DIAGNOSIS — G894 Chronic pain syndrome: Secondary | ICD-10-CM

## 2021-10-02 DIAGNOSIS — R2681 Unsteadiness on feet: Secondary | ICD-10-CM

## 2021-10-02 DIAGNOSIS — R293 Abnormal posture: Secondary | ICD-10-CM

## 2021-10-02 NOTE — Therapy (Signed)
OUTPATIENT PHYSICAL THERAPY TREATMENT NOTE   Patient Name: Nicholas Morse MRN: [id number] DOB:[birth year], 66 y.o., male Today's Date: 10/02/2021   PT End of Session - 10/02/21 0902     Visit Number 18    Number of Visits 24    Date for PT Re-Evaluation 10/22/21    Authorization Type medicare    PT Start Time 0900    PT Stop Time 0945    PT Time Calculation (min) 45 min    Activity Tolerance Patient tolerated treatment well    Behavior During Therapy WFL for tasks assessed/performed               Past Medical History:  Diagnosis Date   Anxiety    Arthritis    DJD (degenerative joint disease)    Hx of seasonal allergies    PMH   Hypothyroidism    Insomnia    Nocturia    Numbness and tingling    B/LLE   Panic attacks    PONV (postoperative nausea and vomiting)    Past Surgical History:  Procedure Laterality Date   BACK SURGERY     CARPAL TUNNEL RELEASE     B/L   COLONOSCOPY     FRACTURE SURGERY     Right knee   KNEE SURGERY     LCL rupture   MAXIMUM ACCESS (MAS)POSTERIOR LUMBAR INTERBODY FUSION (PLIF) 2 LEVEL N/A 11/21/2013   Procedure: FOR MAXIMUM ACCESS (MAS) POSTERIOR LUMBAR INTERBODY FUSION LUMBAR FOUR-FIVE, LUMBAR FIVE-SACRAL ONE WITH REDO DECOMPRESSION;  Surgeon: Erline Levine, MD;  Location: [place] [facility];  Service: Neurosurgery;  Laterality: N/A;   MUSCLE REPAIR     Bicept and Tricept rupture repair surgery   Patient Active Problem List   Diagnosis Date Noted   Spondylolisthesis of lumbar region 11/21/2013     PCP: Neila Gear, MD   REFERRING PROVIDER: Simona Huh, NP   REFERRING DIAG: G89.4 (ICD-10-CM) - Chronic pain syndrome   Rationale for Evaluation and Treatment Rehabilitation   THERAPY DIAG:  Chronic pain syndrome   Unsteadiness on feet   Abnormal posture   ONSET DATE: >10 yrs   SUBJECTIVE:                                                                                                                                                                                             SUBJECTIVE STATEMENT: "Woke up this morning without any pain for the first time in years"   PERTINENT HISTORY:  L shoulder rotator cuff tear with bicep tendon rupture without repair >+5 yrs ago. ROM and strength limited Spinal stimulator DM HTN  PAIN:  Are you having pain? No NPRS scale: 0/10 Pain location: lower thoracic spine/lumbar Pain description:  tight Aggravating factors: bending, twisting, sneezing Relieving factors: stimulator, stretches     PRECAUTIONS: Fall   WEIGHT BEARING RESTRICTIONS No   FALLS:  Has patient fallen in last 6 months? Yes. Number of falls 2   LIVING ENVIRONMENT: Lives with: lives with their family Lives in: House/apartment Stairs: Yes: Internal: 18 steps; can reach both Has following equipment at home: None   OCCUPATION:   retired   PLOF: Independent   PATIENT GOALS Increase flexability     OBJECTIVE:  * Findings taken at [facility] unless otherwise noted.   DIAGNOSTIC FINDINGS:  MRI cervical spine: Cervical Spondylosis 1. 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 2. Mildly worsened moderate right C6-7 neural foraminal stenosis. 3. Unchanged moderate bilateral C3-4 and left C5-6 neural foraminal stenosis. 4. No spinal canal stenosis.   PATIENT SURVEYS:  FOTO 28 with predicted 8 point improvement to 36 08/20/21 45   SCREENING FOR RED FLAGS: Neg for all   COGNITION:            Overall cognitive status: Within functional limits for tasks assessed                          SENSATION: Peripheral neuropathies: bilat feet decreased sensation to light touch; hot/cold   MUSCLE LENGTH: Hamstrings shortened.  Unable to test due to not tolerating   POSTURE: rounded shoulders, forward head, and decreased lumbar lordosis   PALPATION: Minimally tender lumbar and low thoracic paraspinals upper sacrum   LUMBAR ROM:  Limited 80% in all directions Pain with movement > on LvsR      LOWER EXTREMITY ROM:                WFL   LOWER EXTREMITY MMT:     MMT Right eval Left eval Right  09/10/21 Left 09/10/21  Hip flexion 4 Seated 4 seated 5/5 seated 4+/5 seated  Hip extension        Hip abduction 5 5    Hip adduction 5 5    Hip internal rotation        Hip external rotation        Knee flexion 5 5    Knee extension 5 5    Ankle dorsiflexion 5 5    Ankle plantarflexion        Ankle inversion        Ankle eversion         (Blank rows = not tested)   LUMBAR SPECIAL TESTS:  Slump test: Negative   FUNCTIONAL TESTS:  5 times sit to stand: 29s Timed up and go (TUG): 20 Berg Balance Scale: 28/56  Berg Balance                                                              07/30/21   09/03/21 Sit to stand 2 4  Standing unsupported 4 4  Sitting with back unsupported but feet supported 4 4  Stand to sit  2 4  Transfers  3 4  Standing unsupported with eyes closed 3 4  Standing unsupported feet together 3 4  From standing position, reach forward with outstretched arm 2  3  From standing position, pick up object from floor 1 3  From standing position, turn and look behind over each shoulder 2 2  Turn 360 1 2  Standing unsupported, alternately place foot on step 1 4  Standing unsupported, one foot in front 0 3  Standing on one leg 0 1  Total:  28 46      TODAY'S TREATMENT   7/24 Pt seen for aquatic therapy today.  Treatment took place in water 3.25-4.5 ft in depth at the [facility]. Temp of water was 91.  Pt entered/exited the pool via stairs independently with single rail.  * Walking forward, back multiple widths * hamstring /gastroc stretch with foot on 3rd step 3x30 *Figure 4 seated R/L 3x30s  *forward lunge on step stretching to reach feet (pt reports difficulty clipping toe nails) *IT band band stretch leaning against wall 2 x 30s * tandem gait forward/ backward without support  *tandem stand with add/abd ue 1 foam hand buoy *SLS with 2  foam hand buoys decreased to 1 foam/add/abd. Multiple tries. VC and demonstration for execution. Ankle/vs hip strategy *STS from 4th step (bottom) x10 gaining immediate standing balance 10/10 *STS with add set squeezing BB x10 from 4th step (bottom) gaining immediate standing balance 9/10   Pt requires the buoyancy and hydrostatic pressure of water for support, and to offload joints by unweighting joint load by at least 50 % in navel deep water and by at least 75-80% in chest to neck deep water.  Viscosity of the water is needed for resistance of strengthening. Water current perturbations provides challenge to standing balance requiring increased core activation.   7/21 NuStep L6: 7 min for warm up with arms/legs  STS from low table 10lbs 3x5 Paloff Press cable 10lbs  2x10  Shuttle leg press 125lbs 2x10 (staggered stance, alternating) Tandem Balance 30s 3x 6" box step up 5lb cable chop 2x10 NBOS with head turns 30s 3x 80 BPM Tandem stance on foam 30s 2x          PATIENT EDUCATION:  Education details: anatomy, exercise progression, DOMS expectations, muscle firing,  envelope of function, HEP, POC Person educated: Patient Education method: Explanation Education comprehension: verbalized understanding     HOME EXERCISE PROGRAM: Access Code: [email or web address] URL: [email or web address] Date: 09/15/2021 Prepared by: Daleen Bo  Exercises - Standing Anti-Rotation Press with Anchored Resistance  - 1 x daily - 2 x weekly - 2 sets - 10 reps - Side Stepping with Resistance at Thighs  - 1 x daily - 2 x weekly - 3 sets - 10 reps - Sit to Stand  - 1 x daily - 2 x weekly - 3 sets - 10 reps - Standing Tandem Balance with Counter Support  - 1 x daily - 2 x weekly - 2 sets - 4 reps - 30 hold - Seated Figure 4 Piriformis Stretch  - 2 x daily - 7 x weekly - 1 sets - 3 reps - 30 hold - Seated Hamstring Stretch  - 2 x daily - 7 x weekly - 1 sets - 3 reps - 30 hold  ASSESSMENT:    CLINICAL IMPRESSION: Focus on balance static and dynamic. Pt edu on ankle vs hip strategies. He does have increasing peripheral neuropathies (pt reports, tingling in feet) limiting some SLS ability. He continues to progress with balance as strength has improved completing tandem forward and back waling without ue support not LOB. [place] day as pt wakes without pain for the  first time in years. Began DC planning as I do anticipate pt will be ready for dc by end of cert.  Goals ongoing     OBJECTIVE IMPAIRMENTS Abnormal gait, decreased activity tolerance, decreased balance, decreased endurance, decreased mobility, difficulty walking, decreased ROM, decreased strength, impaired flexibility, impaired sensation, and pain.    ACTIVITY LIMITATIONS lifting, bending, stairs, and reach over head   PARTICIPATION LIMITATIONS: cleaning, community activity, and yard work   PERSONAL FACTORS Age, Past/current experiences, Time since onset of injury/illness/exacerbation, and 3+ comorbidities: Left rotator cuff and bicep tendon tears, Cervical Spondylosis, DM, HTN, Lumbar fusion, chronic pain  are also affecting patient's functional outcome.    REHAB POTENTIAL: Good   CLINICAL DECISION MAKING: Unstable/unpredictable   EVALUATION COMPLEXITY: High     GOALS: Goals reviewed with patient? Yes   SHORT TERM GOALS: Target date: 08/27/2021   Pt will acquire cane; it will be properly adjusted to height with instruction on coordinated use to improve toleration to amb/less back fatigue Baseline: Goal status: Achieved 08/25/21   2.  Increased amb distance without  back fatigue 1000 ft to and from aquatics Baseline: 550f Goal status: Achieved 08/25/21   3.  Decrease mid thoracic pain< 4/10 Baseline: 3-4/10 Goal status: Achieved 08/25/21     LONG TERM GOALS: Target date: 10/22/2021   BMerrilee Janskybalance test to improve to at least 35/56 ([facility] to demonstrate decreased fall risk Baseline: 28/56 Goal status:  Achieved 09/03/21   2.  Foto to predicted 36 or greater Baseline: 28 Goal status:Achieved 08/20/21   3.  LE hip strength to 5/5 to demonstrate decreased pain in LB and improved strength Baseline: 4/5 limited to pain Goal status: INITIAL   4.  Pt will improve hamstring length to allow for indep and easy and safe donning and doffing of shoes and socks in seated position Baseline: with difficulty in standing position at eval; can don shoes in seated position Goal status: Partially Met    5.  Pt will report no falls since onset of skilled physical therapy Baseline: 2 last 5 months Goal status: ONGOING          PLAN: PT FREQUENCY: 1-2x/week   PT DURATION: 12 weeks   PLANNED INTERVENTIONS: Therapeutic exercises, Therapeutic activity, Neuromuscular re-education, Balance training, Gait training, Patient/Family education, Joint mobilization, Stair training, DME instructions, Aquatic Therapy, Electrical stimulation, and Taping.   PLAN FOR NEXT SESSION: land strengthening/ core; aquatic therapy for stretching LE and core, strengthening core, balance retraining.    MStanton Kidney(Tharon Aquas Sunil Hue MPT 10/02/21 9:53 AM

## 2021-10-07 ENCOUNTER — Ambulatory Visit (HOSPITAL_BASED_OUTPATIENT_CLINIC_OR_DEPARTMENT_OTHER): Payer: Medicare Other | Attending: Family Medicine | Admitting: Physical Therapy

## 2021-10-07 ENCOUNTER — Encounter (HOSPITAL_BASED_OUTPATIENT_CLINIC_OR_DEPARTMENT_OTHER): Payer: Self-pay | Admitting: Physical Therapy

## 2021-10-07 DIAGNOSIS — R2681 Unsteadiness on feet: Secondary | ICD-10-CM | POA: Diagnosis present

## 2021-10-07 DIAGNOSIS — R293 Abnormal posture: Secondary | ICD-10-CM

## 2021-10-07 DIAGNOSIS — G894 Chronic pain syndrome: Secondary | ICD-10-CM | POA: Diagnosis present

## 2021-10-07 NOTE — Therapy (Addendum)
OUTPATIENT PHYSICAL THERAPY TREATMENT NOTE  PHYSICAL THERAPY DISCHARGE SUMMARY  Visits from Start of Care: 19  Current functional level related to goals / functional outcomes: Pt is safe and indep with all functional mobility and ADLS's   Remaining deficits: Continued some difficulty donning shoes and socks, strength goal not met   Education / Equipment: Management of condition/ HEP   Patient agrees to discharge. Patient goals were partially met. Patient is being discharged due to not returning since the last visit.  Patient Name: Nicholas Morse MRN: [id number] DOB:1955/08/27, 66 y.o., male Today's Date: 10/07/2021   PT End of Session - 10/07/21 1005     Visit Number 19    Number of Visits 24    Date for PT Re-Evaluation 10/22/21    Authorization Type medicare    Progress Note Due on Visit 20    PT Start Time [phone]    PT Stop Time 1025    PT Time Calculation (min) 39 min    Activity Tolerance Patient tolerated treatment well    Behavior During Therapy WFL for tasks assessed/performed                Past Medical History:  Diagnosis Date   Anxiety    Arthritis    DJD (degenerative joint disease)    Hx of seasonal allergies    PMH   Hypothyroidism    Insomnia    Nocturia    Numbness and tingling    B/LLE   Panic attacks    PONV (postoperative nausea and vomiting)    Past Surgical History:  Procedure Laterality Date   BACK SURGERY     CARPAL TUNNEL RELEASE     B/L   COLONOSCOPY     FRACTURE SURGERY     Right knee   KNEE SURGERY     LCL rupture   MAXIMUM ACCESS (MAS)POSTERIOR LUMBAR INTERBODY FUSION (PLIF) 2 LEVEL N/A 11/21/2013   Procedure: FOR MAXIMUM ACCESS (MAS) POSTERIOR LUMBAR INTERBODY FUSION LUMBAR FOUR-FIVE, LUMBAR FIVE-SACRAL ONE WITH REDO DECOMPRESSION;  Surgeon: Erline Levine, MD;  Location: [place] [facility];  Service: Neurosurgery;  Laterality: N/A;   MUSCLE REPAIR     Bicept and Tricept rupture repair surgery   Patient Active Problem List    Diagnosis Date Noted   Spondylolisthesis of lumbar region 11/21/2013     PCP: Neila Gear, MD   REFERRING PROVIDER: Simona Huh, NP   REFERRING DIAG: G89.4 (ICD-10-CM) - Chronic pain syndrome   Rationale for Evaluation and Treatment Rehabilitation   THERAPY DIAG:  Chronic pain syndrome   Unsteadiness on feet   Abnormal posture   ONSET DATE: >10 yrs   SUBJECTIVE:  SUBJECTIVE STATEMENT: "Felt excellent on Sat, today is a good day 2/10, was a little soar after last session the next day"   PERTINENT HISTORY:  L shoulder rotator cuff tear with bicep tendon rupture without repair >+5 yrs ago. ROM and strength limited Spinal stimulator DM HTN   PAIN:  Are you having pain? No NPRS scale: 2/10 Pain location: lower thoracic spine/lumbar Pain description:  tight Aggravating factors: bending, twisting, sneezing Relieving factors: stimulator, stretches     PRECAUTIONS: Fall   WEIGHT BEARING RESTRICTIONS No   FALLS:  Has patient fallen in last 6 months? Yes. Number of falls 2   LIVING ENVIRONMENT: Lives with: lives with their family Lives in: House/apartment Stairs: Yes: Internal: 18 steps; can reach both Has following equipment at home: None   OCCUPATION:   retired   PLOF: Independent   PATIENT GOALS Increase flexability     OBJECTIVE:  * Findings taken at [facility] unless otherwise noted.   DIAGNOSTIC FINDINGS:  MRI cervical spine: Cervical Spondylosis 1. 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 2. Mildly worsened moderate right C6-7 neural foraminal stenosis. 3. Unchanged moderate bilateral C3-4 and left C5-6 neural foraminal stenosis. 4. No spinal canal stenosis.   PATIENT SURVEYS:  FOTO 28 with predicted 8 point improvement to 36 08/20/21 45   SCREENING  FOR RED FLAGS: Neg for all   COGNITION:            Overall cognitive status: Within functional limits for tasks assessed                          SENSATION: Peripheral neuropathies: bilat feet decreased sensation to light touch; hot/cold   MUSCLE LENGTH: Hamstrings shortened.  Unable to test due to not tolerating   POSTURE: rounded shoulders, forward head, and decreased lumbar lordosis   PALPATION: Minimally tender lumbar and low thoracic paraspinals upper sacrum   LUMBAR ROM:  Limited 80% in all directions Pain with movement > on LvsR     LOWER EXTREMITY ROM:                WFL   LOWER EXTREMITY MMT:     MMT Right eval Left eval Right  09/10/21 Left 09/10/21  Hip flexion 4 Seated 4 seated 5/5 seated 4+/5 seated  Hip extension        Hip abduction 5 5    Hip adduction 5 5    Hip internal rotation        Hip external rotation        Knee flexion 5 5    Knee extension 5 5    Ankle dorsiflexion 5 5    Ankle plantarflexion        Ankle inversion        Ankle eversion         (Blank rows = not tested)   LUMBAR SPECIAL TESTS:  Slump test: Negative   FUNCTIONAL TESTS:  5 times sit to stand: 29s Timed up and go (TUG): 20 Berg Balance Scale: 28/56  Berg Balance                                                              07/30/21   09/03/21 Sit to stand 2 4  Standing unsupported 4 4  Sitting with back unsupported but feet supported 4 4  Stand to sit  2 4  Transfers  3 4  Standing unsupported with eyes closed 3 4  Standing unsupported feet together 3 4  From standing position, reach forward with outstretched arm 2 3  From standing position, pick up object from floor 1 3  From standing position, turn and look behind over each shoulder 2 2  Turn 360 1 2  Standing unsupported, alternately place foot on step 1 4  Standing unsupported, one foot in front 0 3  Standing on one leg 0 1  Total:  28 46      TODAY'S TREATMENT   7/24 Pt seen for aquatic therapy today.   Treatment took place in water 3.25-4.5 ft in depth at the [facility]. Temp of water was 91.  Pt entered/exited the pool via stairs independently with single rail.  * Walking forward, back multiple widths *cycling on noodle * tandem gait forward/ backward without support completed in 4 ft then 3.6 ft then 3 foot.  No unsteadiness or LOB *forward walking with head movements vertically x 4 width; horizontally x 4 width * hamstring /gastroc stretch with foot on 3rd step 3x30 *forward lunge on step stretching to reach feet (pt reports difficulty clipping toe nails) *IT band band stretch leaning against wall 2 x 30s *tandem stand with add/abd ue 1 foam hand buoy(as instructed last visit).  Improved. *Adductor sets using BB 10 x 10s hold *STS from 4th step (bottom) x10 gaining immediate standing balance 10/10 *STS with add set squeezing BB x5 from 3rd step (bottom) gaining immediate standing balance 5/5; with add set x5 gaining immediate standing balance 1/5 x   Pt requires the buoyancy and hydrostatic pressure of water for support, and to offload joints by unweighting joint load by at least 50 % in navel deep water and by at least 75-80% in chest to neck deep water.  Viscosity of the water is needed for resistance of strengthening. Water current perturbations provides challenge to standing balance requiring increased core activation.   7/21 NuStep L6: 7 min for warm up with arms/legs  STS from low table 10lbs 3x5 Paloff Press cable 10lbs  2x10  Shuttle leg press 125lbs 2x10 (staggered stance, alternating) Tandem Balance 30s 3x 6" box step up 5lb cable chop 2x10 NBOS with head turns 30s 3x 80 BPM Tandem stance on foam 30s 2x          PATIENT EDUCATION:  Education details: anatomy, exercise progression, DOMS expectations, muscle firing,  envelope of function, HEP, POC Person educated: Patient Education method: Explanation Education comprehension: verbalized  understanding     HOME EXERCISE PROGRAM: Access Code: [email or web address] URL: [email or web address] Date: 09/15/2021 Prepared by: Daleen Bo  Exercises - Standing Anti-Rotation Press with Anchored Resistance  - 1 x daily - 2 x weekly - 2 sets - 10 reps - Side Stepping with Resistance at Thighs  - 1 x daily - 2 x weekly - 3 sets - 10 reps - Sit to Stand  - 1 x daily - 2 x weekly - 3 sets - 10 reps - Standing Tandem Balance with Counter Support  - 1 x daily - 2 x weekly - 2 sets - 4 reps - 30 hold - Seated Figure 4 Piriformis Stretch  - 2 x daily - 7 x weekly - 1 sets - 3 reps - 30 hold - Seated Hamstring Stretch  - 2 x  daily - 7 x weekly - 1 sets - 3 reps - 30 hold  ASSESSMENT:   CLINICAL IMPRESSION: Re-assessment due next visit (being seen "on land") Some unsteadiness with head movements recovers indep. Tandem walking without unsteadiness or LOB. Pt with improved SLS using ankle strategy holding x 30s. Pt demonstrating improved balance and strength with indep with SLS and completion of STS gaining immediate standing balance with each rep. He will be ready for DC at end of cert.  He does have access to pool until end of summer then anticipates participation in our transitional program. Will plan to see pt in pool x 2 more visits assigning laminated HEP ensuring understanding and indep with.      OBJECTIVE IMPAIRMENTS Abnormal gait, decreased activity tolerance, decreased balance, decreased endurance, decreased mobility, difficulty walking, decreased ROM, decreased strength, impaired flexibility, impaired sensation, and pain.    ACTIVITY LIMITATIONS lifting, bending, stairs, and reach over head   PARTICIPATION LIMITATIONS: cleaning, community activity, and yard work   PERSONAL FACTORS Age, Past/current experiences, Time since onset of injury/illness/exacerbation, and 3+ comorbidities: Left rotator cuff and bicep tendon tears, Cervical Spondylosis, DM, HTN, Lumbar fusion, chronic  pain  are also affecting patient's functional outcome.    REHAB POTENTIAL: Good   CLINICAL DECISION MAKING: Unstable/unpredictable   EVALUATION COMPLEXITY: High     GOALS: Goals reviewed with patient? Yes   SHORT TERM GOALS: Target date: 08/27/2021   Pt will acquire cane; it will be properly adjusted to height with instruction on coordinated use to improve toleration to amb/less back fatigue Baseline: Goal status: Achieved 08/25/21   2.  Increased amb distance without  back fatigue 1000 ft to and from aquatics Baseline: 568f Goal status: Achieved 08/25/21   3.  Decrease mid thoracic pain< 4/10 Baseline: 3-4/10 Goal status: Achieved 08/25/21     LONG TERM GOALS: Target date: 10/22/2021   BMerrilee Janskybalance test to improve to at least 35/56 ([facility] to demonstrate decreased fall risk Baseline: 28/56 Goal status: Achieved 09/03/21   2.  Foto to predicted 36 or greater Baseline: 28 Goal status:Achieved 08/20/21   3.  LE hip strength to 5/5 to demonstrate decreased pain in LB and improved strength Baseline: 4/5 limited to pain Goal status: INITIAL   4.  Pt will improve hamstring length to allow for indep and easy and safe donning and doffing of shoes and socks in seated position Baseline: with difficulty in standing position at eval; can don shoes in seated position Goal status: Partially Met    5.  Pt will report no falls since onset of skilled physical therapy Baseline: 2 last 5 months Goal status: ONGOING          PLAN: PT FREQUENCY: 1-2x/week   PT DURATION: 12 weeks   PLANNED INTERVENTIONS: Therapeutic exercises, Therapeutic activity, Neuromuscular re-education, Balance training, Gait training, Patient/Family education, Joint mobilization, Stair training, DME instructions, Aquatic Therapy, Electrical stimulation, and Taping.   PLAN FOR NEXT SESSION: Progress  note.  Ensure indep with ON land HEP.  Assign aquatic HEP; DC planning    MStanton Kidney(Tharon Aquas Latiffany Harwick MPT 10/07/21 1:51  PM   Addended MStanton Kidney(Tharon Aquas [place] Sigmund MPT 01/22/22 424p

## 2021-10-14 ENCOUNTER — Ambulatory Visit (HOSPITAL_BASED_OUTPATIENT_CLINIC_OR_DEPARTMENT_OTHER): Payer: Medicare Other | Admitting: Physical Therapy

## 2023-03-10 DEATH — deceased

## 2023-06-15 IMAGING — MR MR CERVICAL SPINE W/O CM
5 series · 38 of 48 positions shown · non-contrast
Comparison: None.

CLINICAL DATA: Cervical spondylosis

EXAM:
MRI CERVICAL SPINE WITHOUT CONTRAST
TECHNIQUE: Multiplanar, multisequence MR imaging of the cervical spine was
performed. No intravenous contrast was administered.

[Series 5: T2 · sagittal · 3.0mm · 0.69mm/px · 6 of 15 slices shown (1 of 2)]
[im 1/15]
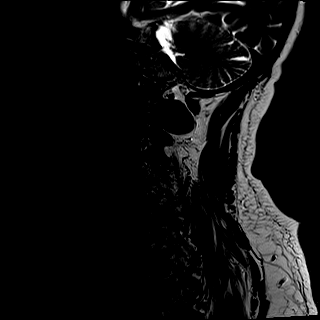
[im 3/15]
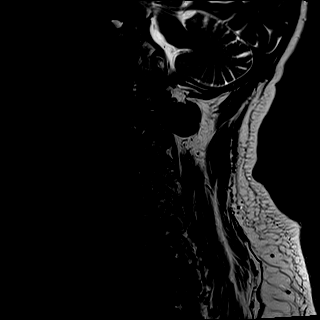
[im 6/15]
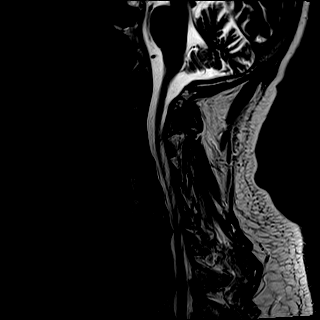
[im 9/15]
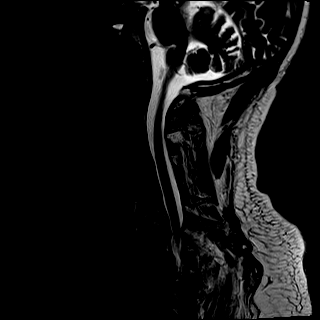
[im 12/15]
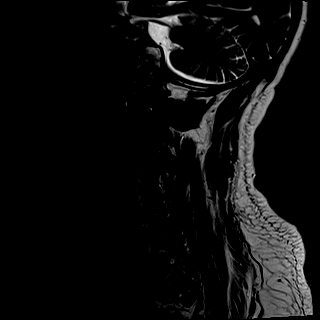
[im 15/15]
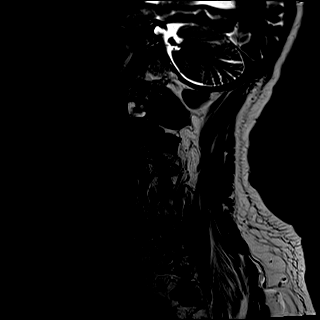

[Series 6: T1 · sagittal · 3.0mm · 0.69mm/px · 6 of 15 slices shown]
[im 1/15]
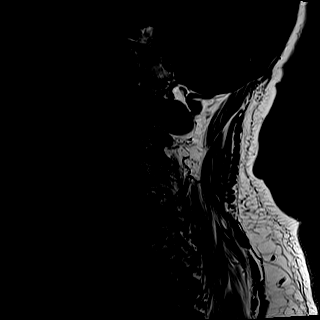
[im 3/15]
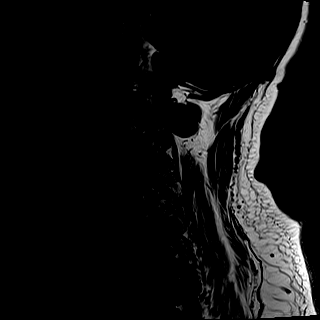
[im 6/15]
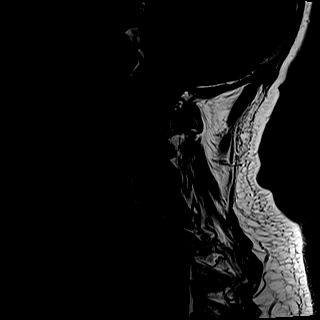
[im 9/15]
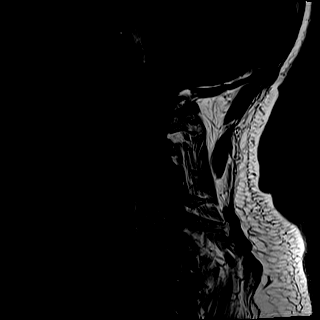
[im 12/15]
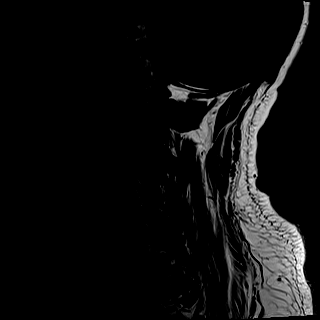
[im 15/15]
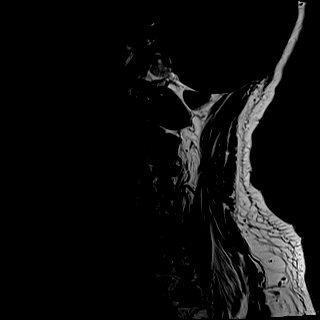

[Series 7: STIR · sagittal · 3.0mm · 0.86mm/px · 6 of 15 slices shown]
[im 1/15]
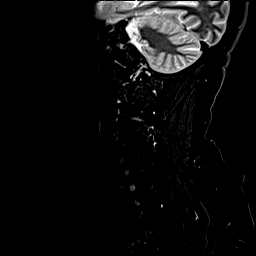
[im 3/15]
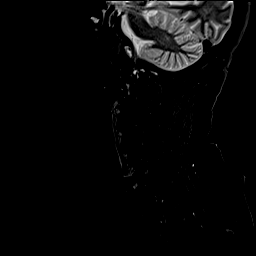
[im 6/15]
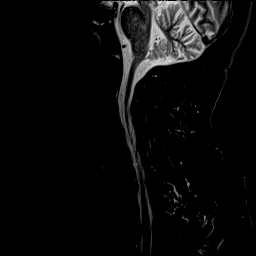
[im 9/15]
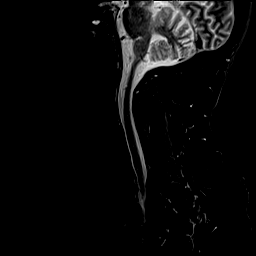
[im 12/15]
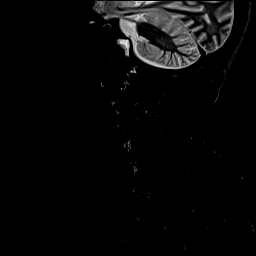
[im 15/15]
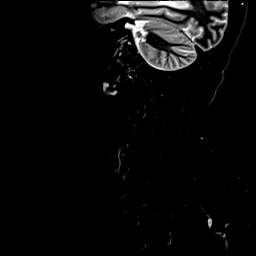

[Series 8: T2 · axial · 3.0mm · 0.66mm/px · z∈[-161,-40]mm · 12 of 40 slices shown (2 of 2)]
[im 1/40]
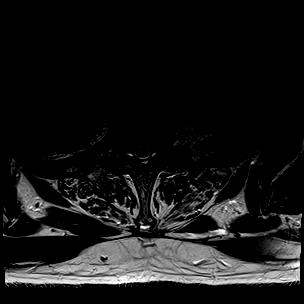
[im 3/40]
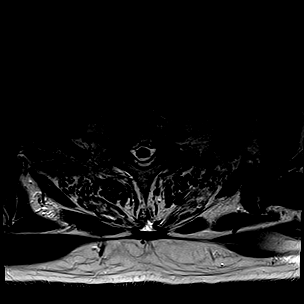
[im 6/40]
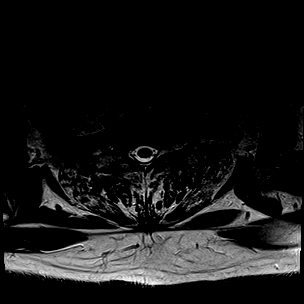
[im 9/40]
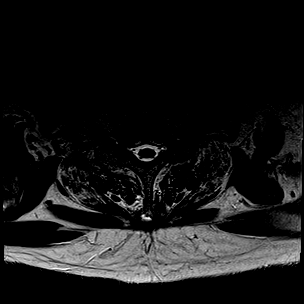
[im 12/40]
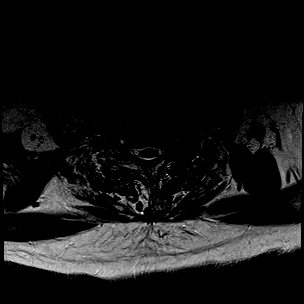
[im 14/40]
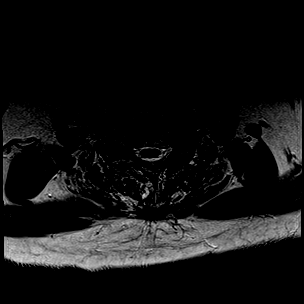
[im 17/40]
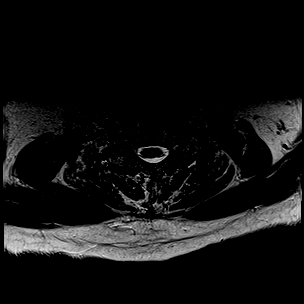
[im 20/40]
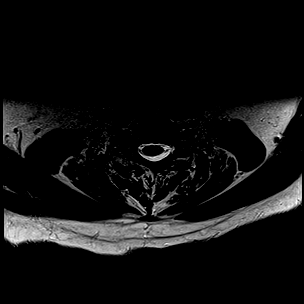
[im 23/40]
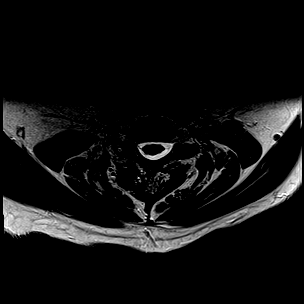
[im 28/40]
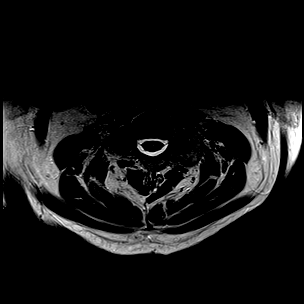
[im 34/40]
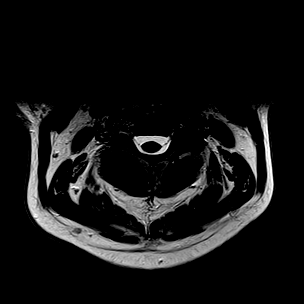
[im 40/40]
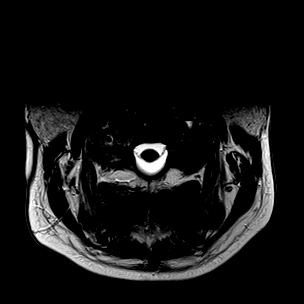

[Series 9: GRE · axial · 3.0mm · 0.39mm/px · z∈[-163,-42]mm · 8 of 40 slices shown]
[im 1/40]
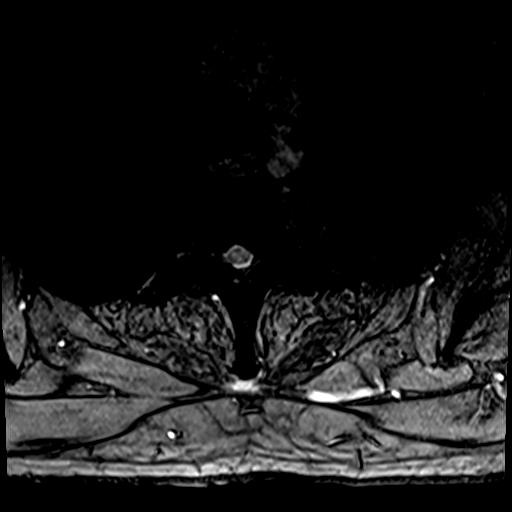
[im 6/40]
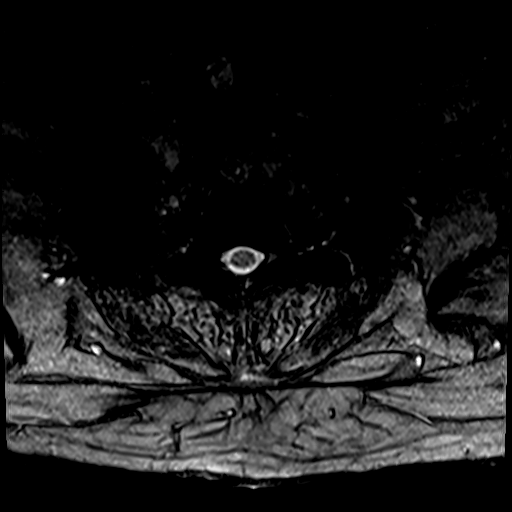
[im 12/40]
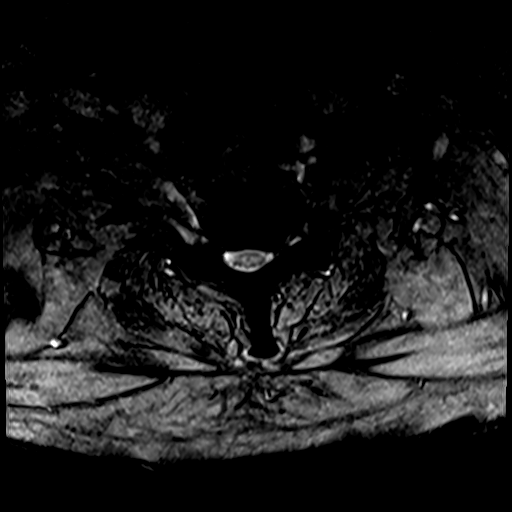
[im 17/40]
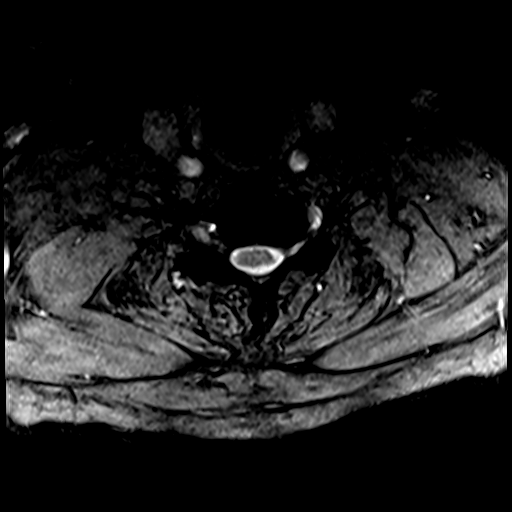
[im 23/40]
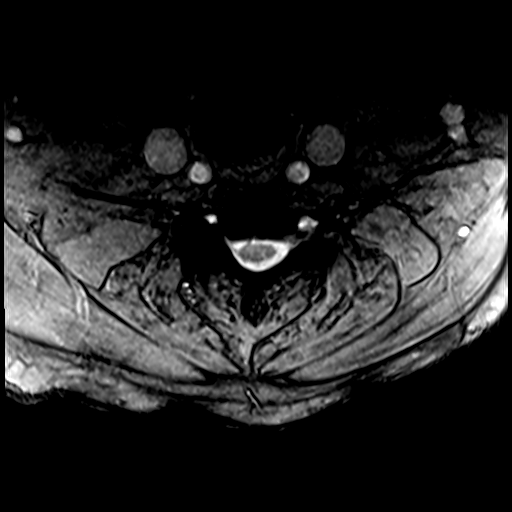
[im 28/40]
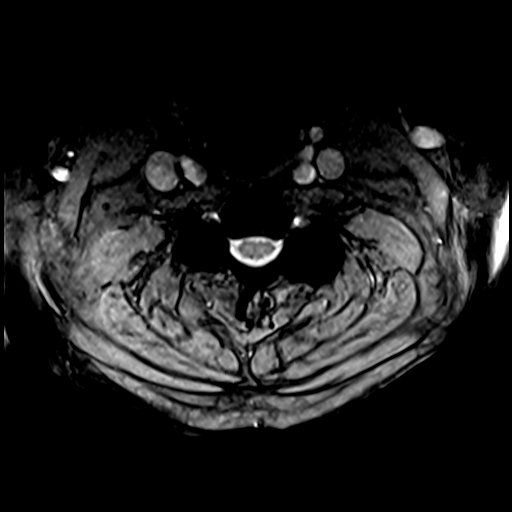
[im 34/40]
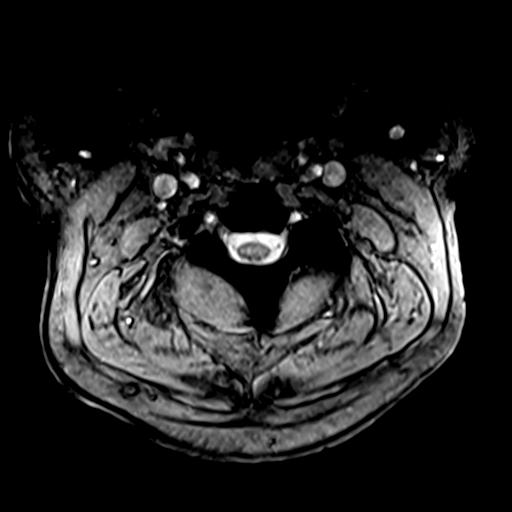
[im 40/40]
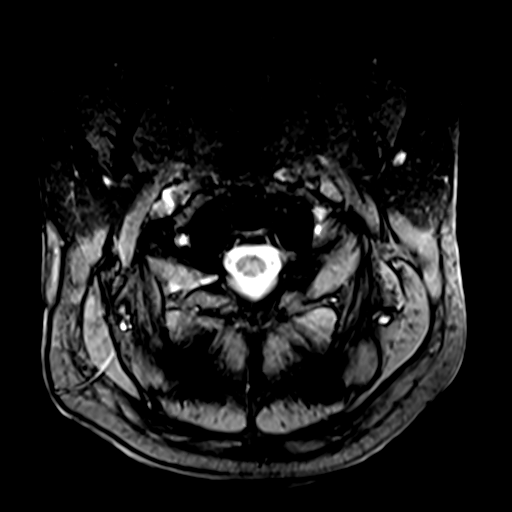

[38 of 48 positions shown; findings below may reference images not displayed]

FINDINGS: Alignment: Physiologic.

Vertebrae: No fracture, evidence of discitis, or bone lesion.

Cord: Normal signal and morphology.

Posterior Fossa, vertebral arteries, paraspinal tissues: Negative.

Disc levels:

C1-2: Unremarkable.

C2-3: Moderate left facet hypertrophy. There is no spinal canal
stenosis. Mild neural foraminal stenosis.

C3-4: Moderate hypertrophy and small bilateral uncovertebral
spurring. There is no spinal canal stenosis. Unchanged moderate
bilateral neural foraminal stenosis.

C4-5: Moderate right facet hypertrophy. There is no spinal canal
stenosis. Worsened severe right neural foraminal stenosis.

C5-6: Disc space narrowing with endplate spurring. There is no
spinal canal stenosis. Unchanged moderate left neural foraminal
stenosis.

C6-7: Small disc bulge with endplate spurring. There is no spinal
canal stenosis. Mildly worsened moderate right and neural foraminal
stenosis.

C7-T1: Normal disc space and facet joints. There is no spinal canal
stenosis. No neural foraminal stenosis.
IMPRESSION: 1. Severe right C4-5 neural foraminal stenosis, worsened.
2. Mildly worsened moderate right C6-7 neural foraminal stenosis.
3. Unchanged moderate bilateral C3-4 and left C5-6 neural foraminal
stenosis.
4. No spinal canal stenosis.
